# Patient Record
Sex: Female | Born: 1955 | Hispanic: No | Marital: Married | Smoking: Never smoker
Health system: Southern US, Community
[De-identification: ages and names within clinical notes are randomized; demographics above are authoritative.]

## PROBLEM LIST (undated history)

## (undated) DIAGNOSIS — F411 Generalized anxiety disorder: Secondary | ICD-10-CM

## (undated) DIAGNOSIS — M545 Low back pain: Secondary | ICD-10-CM

## (undated) DIAGNOSIS — E785 Hyperlipidemia, unspecified: Secondary | ICD-10-CM

## (undated) DIAGNOSIS — R079 Chest pain, unspecified: Secondary | ICD-10-CM

## (undated) DIAGNOSIS — IMO0002 Reserved for concepts with insufficient information to code with codable children: Secondary | ICD-10-CM

## (undated) DIAGNOSIS — R209 Unspecified disturbances of skin sensation: Secondary | ICD-10-CM

## (undated) DIAGNOSIS — I10 Essential (primary) hypertension: Secondary | ICD-10-CM

## (undated) DIAGNOSIS — K130 Diseases of lips: Secondary | ICD-10-CM

## (undated) DIAGNOSIS — Z86010 Personal history of colonic polyps: Secondary | ICD-10-CM

## (undated) DIAGNOSIS — J309 Allergic rhinitis, unspecified: Secondary | ICD-10-CM

## (undated) DIAGNOSIS — H3530 Unspecified macular degeneration: Secondary | ICD-10-CM

## (undated) HISTORY — DX: Chest pain, unspecified: R07.9

## (undated) HISTORY — PX: CHOLECYSTECTOMY: SHX55

## (undated) HISTORY — DX: Reserved for concepts with insufficient information to code with codable children: IMO0002

## (undated) HISTORY — DX: Unspecified disturbances of skin sensation: R20.9

## (undated) HISTORY — DX: Low back pain: M54.5

## (undated) HISTORY — DX: Generalized anxiety disorder: F41.1

## (undated) HISTORY — DX: Diseases of lips: K13.0

## (undated) HISTORY — DX: Allergic rhinitis, unspecified: J30.9

## (undated) HISTORY — DX: Essential (primary) hypertension: I10

## (undated) HISTORY — DX: Unspecified macular degeneration: H35.30

## (undated) HISTORY — DX: Personal history of colonic polyps: Z86.010

## (undated) HISTORY — DX: Hyperlipidemia, unspecified: E78.5

---

## 2002-08-02 ENCOUNTER — Other Ambulatory Visit: Admission: RE | Admit: 2002-08-02 | Discharge: 2002-08-02 | Payer: Self-pay | Admitting: Obstetrics & Gynecology

## 2002-08-18 ENCOUNTER — Encounter: Payer: Self-pay | Admitting: Obstetrics & Gynecology

## 2002-08-18 ENCOUNTER — Encounter: Admission: RE | Admit: 2002-08-18 | Discharge: 2002-08-18 | Payer: Self-pay | Admitting: Obstetrics & Gynecology

## 2003-02-27 ENCOUNTER — Ambulatory Visit (HOSPITAL_COMMUNITY): Admission: RE | Admit: 2003-02-27 | Discharge: 2003-02-27 | Payer: Self-pay | Admitting: Gastroenterology

## 2003-10-04 ENCOUNTER — Encounter: Payer: Self-pay | Admitting: Family Medicine

## 2003-10-04 ENCOUNTER — Encounter: Admission: RE | Admit: 2003-10-04 | Discharge: 2003-10-04 | Payer: Self-pay | Admitting: Family Medicine

## 2004-06-24 ENCOUNTER — Ambulatory Visit (HOSPITAL_COMMUNITY): Admission: RE | Admit: 2004-06-24 | Discharge: 2004-06-24 | Payer: Self-pay | Admitting: Internal Medicine

## 2005-01-01 ENCOUNTER — Ambulatory Visit (HOSPITAL_COMMUNITY): Admission: RE | Admit: 2005-01-01 | Discharge: 2005-01-01 | Payer: Self-pay | Admitting: Family Medicine

## 2005-07-02 IMAGING — CR DG CERVICAL SPINE COMPLETE 4+V
5 series · 5 of 5 positions shown · non-contrast
Comparison: none

CLINICAL DATA: Posterior neck pain.  
 CERVICAL SPINE FIVE VIEWS, 06/24/04
 There is no evidence of fracture or prevertebral soft tissue swelling. Alignment is normal. The intervertebral disk spaces are within normal limits and no other significant bone abnormalities are identified.
 IMPRESSION
 Negative cervical spine radiographs.

[view not recorded (1 of 5)]
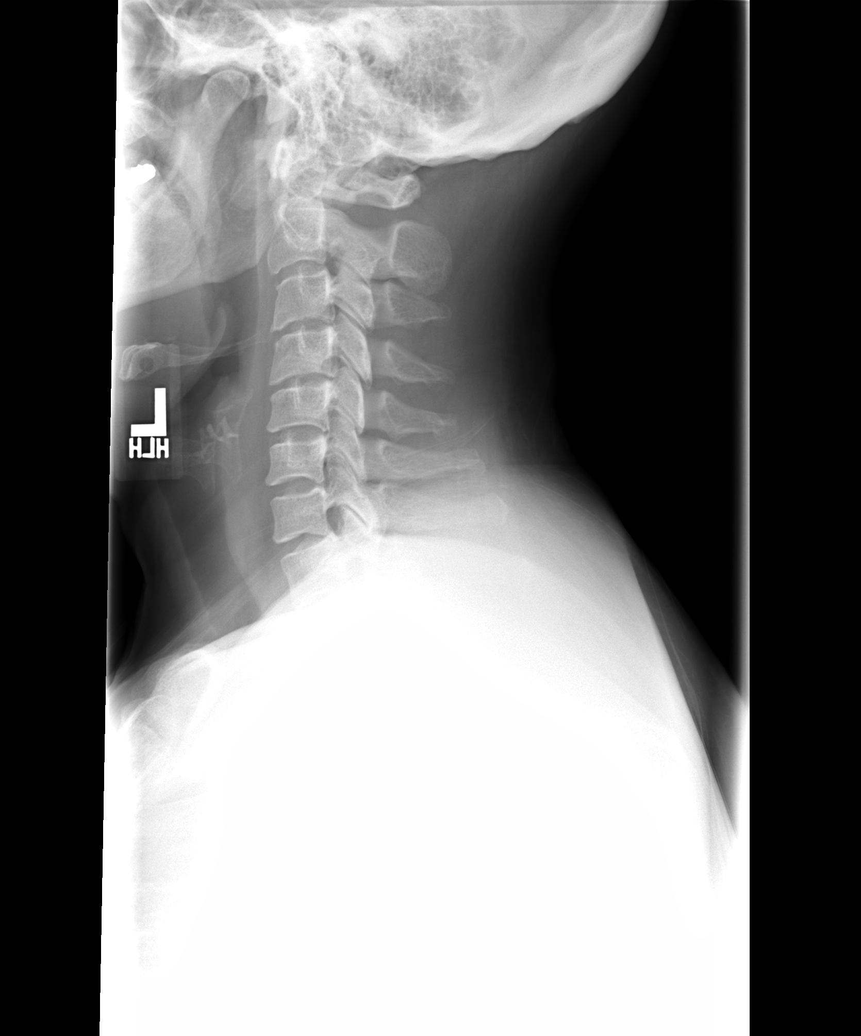

[view not recorded (2 of 5)]
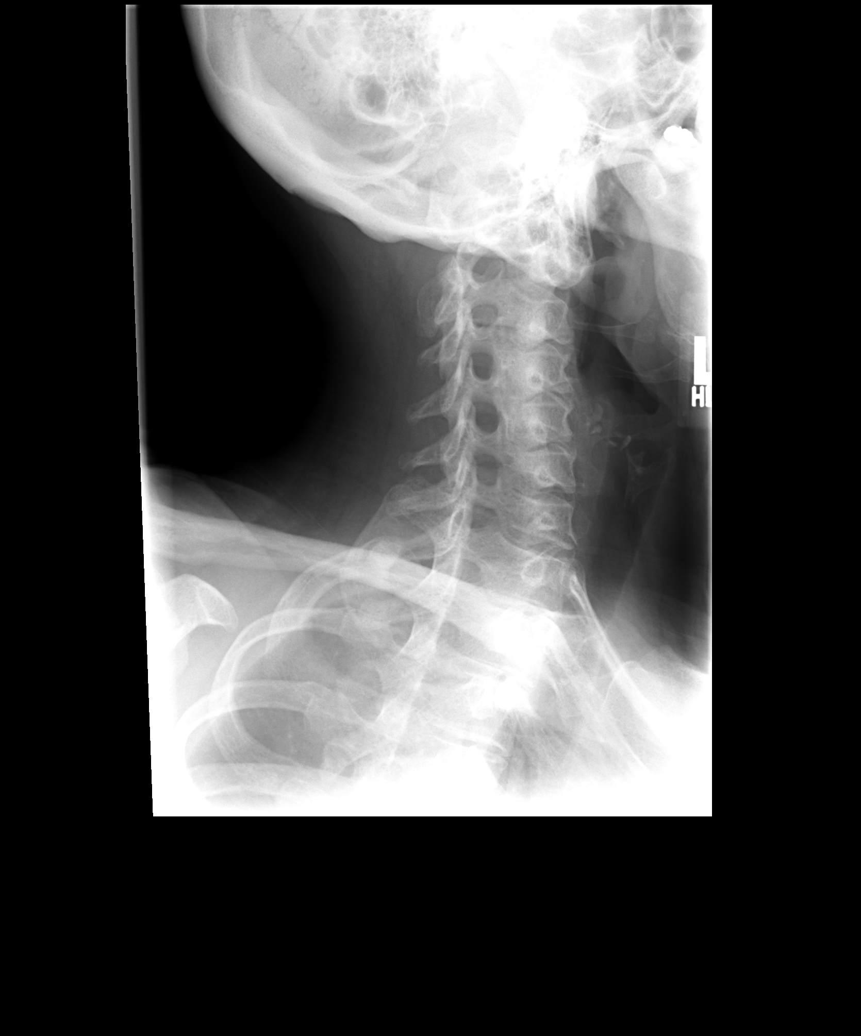

[view not recorded (3 of 5)]
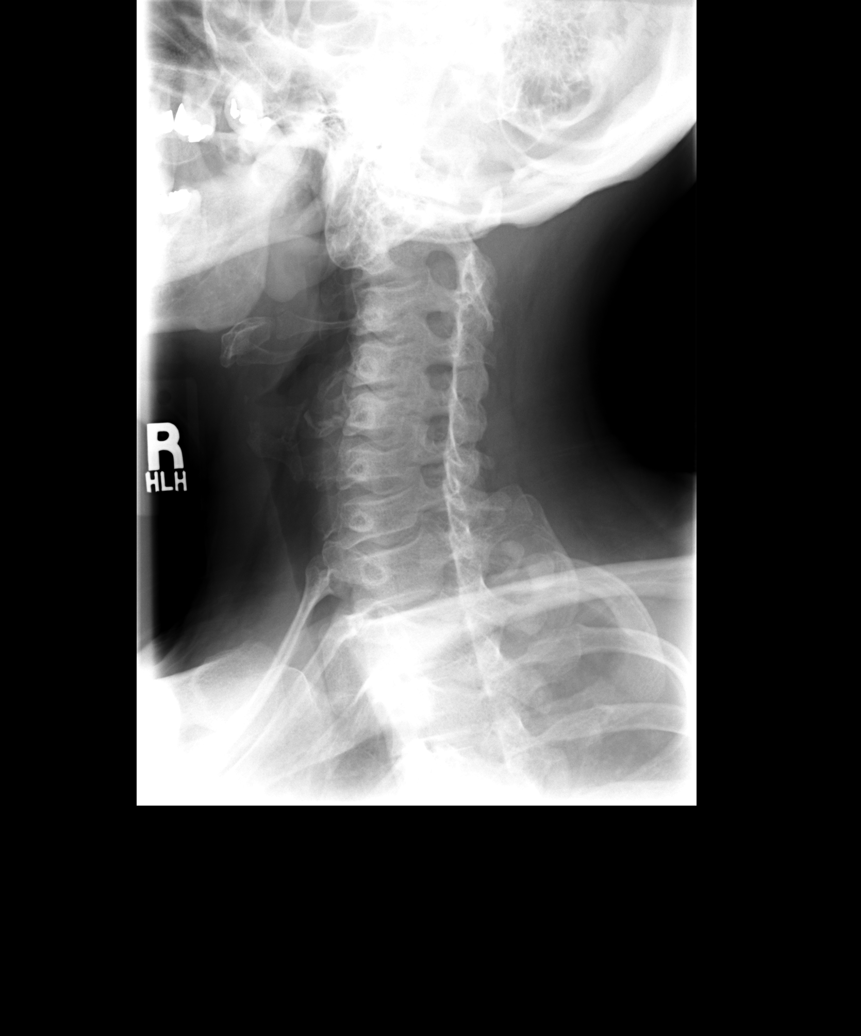

[view not recorded (4 of 5)]
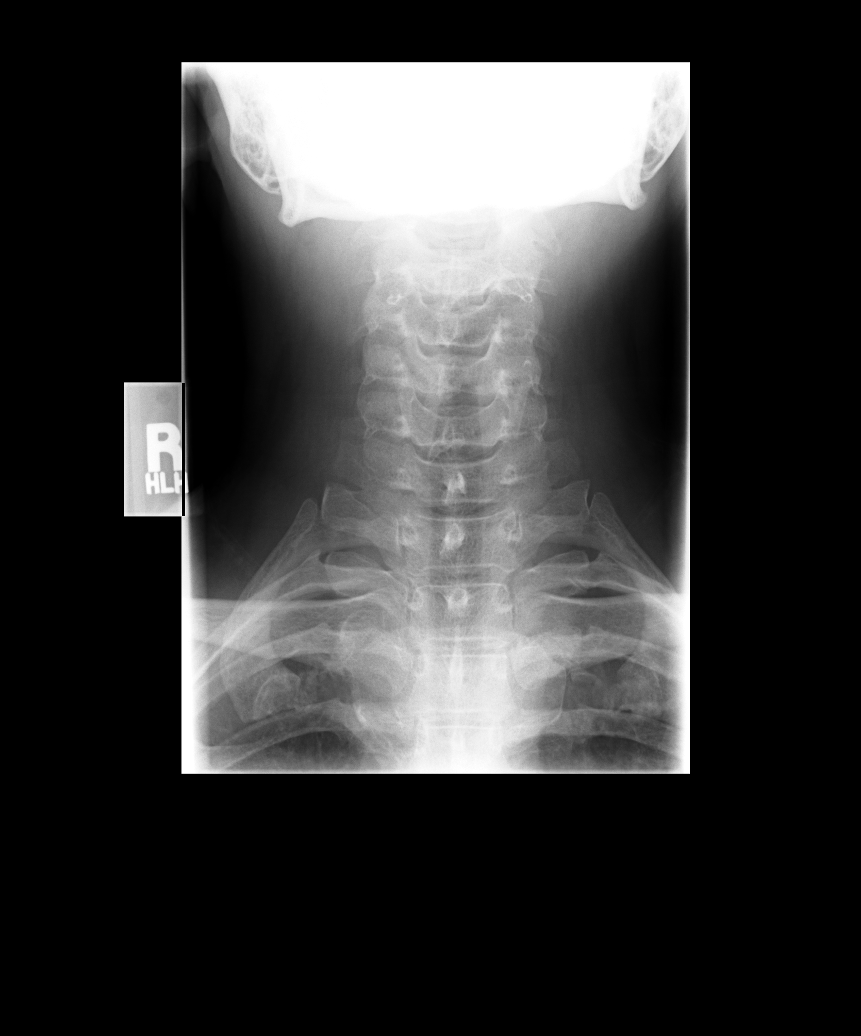

[view not recorded (5 of 5)]
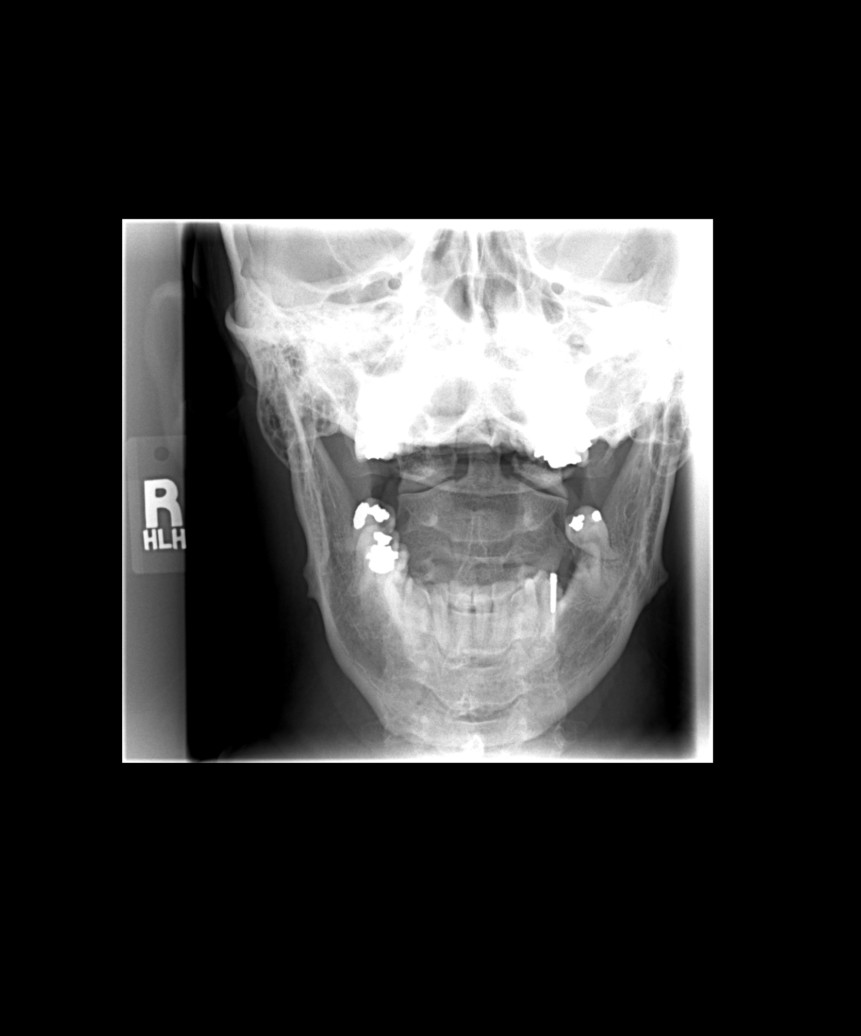

[5 of 5 positions shown; findings below may reference images not displayed]

## 2005-11-19 ENCOUNTER — Ambulatory Visit: Payer: Self-pay | Admitting: Family Medicine

## 2006-01-14 ENCOUNTER — Ambulatory Visit (HOSPITAL_COMMUNITY): Admission: RE | Admit: 2006-01-14 | Discharge: 2006-01-14 | Payer: Self-pay | Admitting: Family Medicine

## 2006-08-12 ENCOUNTER — Ambulatory Visit: Payer: Self-pay | Admitting: Family Medicine

## 2006-12-29 LAB — HM COLONOSCOPY

## 2007-09-01 ENCOUNTER — Ambulatory Visit (HOSPITAL_COMMUNITY): Admission: RE | Admit: 2007-09-01 | Discharge: 2007-09-01 | Payer: Self-pay | Admitting: Obstetrics & Gynecology

## 2008-10-09 ENCOUNTER — Ambulatory Visit (HOSPITAL_COMMUNITY): Admission: RE | Admit: 2008-10-09 | Discharge: 2008-10-09 | Payer: Self-pay | Admitting: Obstetrics & Gynecology

## 2009-11-09 LAB — HM MAMMOGRAPHY: HM Mammogram: NORMAL

## 2010-08-29 ENCOUNTER — Encounter: Payer: Self-pay | Admitting: Internal Medicine

## 2010-08-29 ENCOUNTER — Ambulatory Visit: Payer: Self-pay | Admitting: Internal Medicine

## 2010-08-29 DIAGNOSIS — I10 Essential (primary) hypertension: Secondary | ICD-10-CM | POA: Insufficient documentation

## 2010-08-29 DIAGNOSIS — Z860100 Personal history of colon polyps, unspecified: Secondary | ICD-10-CM

## 2010-08-29 DIAGNOSIS — K130 Diseases of lips: Secondary | ICD-10-CM

## 2010-08-29 DIAGNOSIS — J309 Allergic rhinitis, unspecified: Secondary | ICD-10-CM

## 2010-08-29 DIAGNOSIS — H3530 Unspecified macular degeneration: Secondary | ICD-10-CM | POA: Insufficient documentation

## 2010-08-29 DIAGNOSIS — F411 Generalized anxiety disorder: Secondary | ICD-10-CM | POA: Insufficient documentation

## 2010-08-29 DIAGNOSIS — J302 Other seasonal allergic rhinitis: Secondary | ICD-10-CM

## 2010-08-29 DIAGNOSIS — J3089 Other allergic rhinitis: Secondary | ICD-10-CM

## 2010-08-29 HISTORY — DX: Generalized anxiety disorder: F41.1

## 2010-08-29 HISTORY — DX: Essential (primary) hypertension: I10

## 2010-08-29 HISTORY — DX: Diseases of lips: K13.0

## 2010-08-29 HISTORY — DX: Unspecified macular degeneration: H35.30

## 2010-08-29 HISTORY — DX: Personal history of colon polyps, unspecified: Z86.0100

## 2010-08-29 HISTORY — DX: Allergic rhinitis, unspecified: J30.9

## 2010-08-29 HISTORY — DX: Personal history of colonic polyps: Z86.010

## 2010-08-29 LAB — CONVERTED CEMR LAB
ALT: 46 units/L — ABNORMAL HIGH (ref 0–35)
Albumin: 4 g/dL (ref 3.5–5.2)
Alkaline Phosphatase: 62 units/L (ref 39–117)
BUN: 15 mg/dL (ref 6–23)
Basophils Absolute: 0.1 10*3/uL (ref 0.0–0.1)
Bilirubin Urine: NEGATIVE
Bilirubin, Direct: 0.2 mg/dL (ref 0.0–0.3)
Chloride: 101 meq/L (ref 96–112)
Cholesterol: 226 mg/dL — ABNORMAL HIGH (ref 0–200)
Eosinophils Absolute: 0.3 10*3/uL (ref 0.0–0.7)
Eosinophils Relative: 4.9 % (ref 0.0–5.0)
GFR calc non Af Amer: 92.38 mL/min (ref >60)
Glucose, Bld: 104 mg/dL — ABNORMAL HIGH (ref 70–99)
Hemoglobin: 13.9 g/dL (ref 12.0–15.0)
Monocytes Relative: 8.5 % (ref 3.0–12.0)
Potassium: 4.2 meq/L (ref 3.5–5.1)
RBC: 4.61 M/uL (ref 3.87–5.11)
Sodium: 140 meq/L (ref 135–145)
TSH: 1.49 microintl units/mL (ref 0.35–5.50)
Total Protein, Urine: NEGATIVE mg/dL
Total Protein: 6.6 g/dL (ref 6.0–8.3)
Triglycerides: 113 mg/dL (ref 0.0–149.0)
VLDL: 22.6 mg/dL (ref 0.0–40.0)
Vitamin B-12: 480 pg/mL (ref 211–911)
WBC: 6.4 10*3/uL (ref 4.5–10.5)
pH: 6 (ref 5.0–8.0)

## 2011-01-28 NOTE — Assessment & Plan Note (Signed)
Summary: NEW PT FOR CPX/BCBS/#/CD   Vital Signs:  Patient profile:   55 year old female Height:      62 inches Weight:      130.38 pounds BMI:     23.93 O2 Sat:      95 % Temp:     98.2 degrees F oral Pulse rate:   72 / minute BP sitting:   116 / 74  (left arm) Cuff size:   regular  Vitals Entered By: Margaret Pyle, CMA (August 29, 2010 8:41 AM)  Preventive Care Screening  Colonoscopy:    Date:  12/29/2006    Next Due:  12/2016    Results:  Hyperplastic Polyp   Last Tetanus Booster:    Date:  08/29/2010    Results:  Tdap  Mammogram:    Date:  11/09/2009    Results:  normal   Pap Smear:    Date:  11/07/2009    Results:  normal   CC: New Pt- Establish Care Is Patient Diabetic? No   CC:  New Pt- Establish Care.  History of Present Illness: here to establish , coming here as husband also establshing as well;  started menopause renct;  LMP march 2011;  c/o recurrent insomnia, and eye and nasal allergy symptoms with congetsion for several months with pain and pressure but no fever;  has rare nosebleed as well.  Also with ongoing anxiety without depressive symtpoms, but has near panic on occasion.  Also wtih signficiant trobule getting to sleep..  No suicidal ideation.   Preventive Screening-Counseling & Management  Alcohol-Tobacco     Smoking Status: never      Drug Use:  no.    Problems Prior to Update: None  Medications Prior to Update: 1)  None  Current Medications (verified): 1)  Singulair 10 Mg Tabs (Montelukast Sodium) .Marland Kitchen.. 1 Tab By Mouth Once Daily 2)  Melatonin 2.5 Mg Tabs (Melatonin) .Marland Kitchen.. 1 Tab By Mouth At Bedtime As Needed For Sleep 3)  Hydrochlorothiazide 25 Mg Tabs (Hydrochlorothiazide) .Marland Kitchen.. 1 Tab By Mouth Once Daily 4)  Fluoxetine Hcl 20 Mg Caps (Fluoxetine Hcl) .Marland Kitchen.. 1 By Mouth Once Daily 5)  Alprazolam 0.5 Mg Tabs (Alprazolam) .... 1/2 - 1 By Mouth Once Daily As Needed 6)  Zolpidem Tartrate 10 Mg Tabs (Zolpidem Tartrate) .Marland Kitchen.. 1po At  Bedtime As Needed Sleep 7)  Fluticasone Propionate 50 Mcg/act Susp (Fluticasone Propionate) .... 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 Family History of Arthritis - mother Family History Breast cancer 1st degree relative - sister at 83yo  Family History Hypertension - father Family History Lung cancer - father   Social History: Reviewed history and no changes required. 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 Never Smoked Alcohol use-no Drug use-no came to [place] from [place] 1995 due to religious intolerance thereSmoking Status:  never Drug Use:  no  Review of Systems  The patient denies anorexia, fever, weight loss, weight gain, vision loss, decreased hearing, hoarseness, chest pain, syncope, dyspnea on exertion, peripheral edema, prolonged cough, headaches, hemoptysis, abdominal pain, melena, hematochezia, severe indigestion/heartburn, hematuria, muscle weakness, suspicious skin lesions, transient blindness, difficulty walking, depression, unusual weight change, abnormal bleeding, enlarged lymph nodes, and angioedema.         all otherwise negative per pt -  does have right side angular chelitis as well, worse after being at the beach  Physical Exam  General:   alert and well-developed.   Head:  normocephalic and atraumatic.   Eyes:  vision grossly intact, pupils equal, and pupils round.   Ears:  R ear normal and L ear normal.   Nose:  no external deformity and no nasal discharge.   Mouth:  no gingival abnormalities and pharynx pink and moist.   Neck:  supple and no masses.   Lungs:  normal respiratory effort and normal breath sounds.   Heart:  normal rate and regular rhythm.   Abdomen:  soft, non-tender, and normal bowel sounds.   Msk:  no joint tenderness and no joint swelling.   Extremities:  no edema, no erythema  Neurologic:  cranial nerves II-XII intact and strength normal in all extremities.   Skin:  color normal and no rashes.   Psych:  memory intact for recent and remote and normally interactive.     Impression & Recommendations:  Problem # 1:  Preventive Health Care (ICD-V70.0)  Overall doing well, age appropriate education and counseling updated and referral for appropriate preventive services done unless declined, immunizations up to date or declined, diet counseling done if overweight, urged to quit smoking if smokes , most recent labs reviewed and current ordered if appropriate, ecg reviewed or declined (interpretation per ECG scanned in the EMR if done); information regarding Medicare Prevention requirements given if appropriate; speciality referrals updated as appropriate   Orders: TLB-BMP (Basic Metabolic Panel-BMET) (80048-METABOL) TLB-CBC Platelet - w/Differential (85025-CBCD) TLB-Hepatic/Liver Function Pnl (80076-HEPATIC) TLB-TSH (Thyroid Stimulating Hormone) (84443-TSH) TLB-Lipid Panel (80061-LIPID) TLB-Udip ONLY (81003-UDIP)  Problem # 2:  ALLERGIC RHINITIS (ICD-477.9)  Her updated medication list for this problem includes:    Fluticasone Propionate 50 Mcg/act Susp (Fluticasone propionate) .Marland Kitchen... 2 spray/side once daily treat as above, f/u any worsening signs or symptoms , You can also use Mucinex OTC or it's generic  for congestion   Problem # 3:  ANXIETY (ICD-300.00)  Her updated medication list for this problem includes:    Fluoxetine Hcl 20 Mg Caps (Fluoxetine hcl) .Marland Kitchen... 1 by mouth once daily    Alprazolam 0.5 Mg Tabs (Alprazolam) .Marland Kitchen... 1/2 - 1 by mouth once daily as needed treat as above, f/u any worsening signs or symptoms   Problem # 4:  CHEILITIS, ANGULAR (ICD-528.5)  for lotrisone as needed, as well as B12 check  Orders: TLB-B12 + Folate Pnl ([id number])  Complete Medication List: 1)  Singulair 10 Mg Tabs (Montelukast sodium) .Marland Kitchen.. 1 tab by mouth once daily 2)  Melatonin 2.5 Mg Tabs (Melatonin) .Marland Kitchen.. 1 tab by mouth at bedtime as needed for sleep 3)  Hydrochlorothiazide 25 Mg Tabs (Hydrochlorothiazide) .Marland Kitchen.. 1 tab by mouth once daily 4)  Fluoxetine Hcl 20 Mg Caps (Fluoxetine hcl) .Marland Kitchen.. 1 by mouth once daily 5)  Alprazolam 0.5 Mg Tabs (Alprazolam) .... 1/2 - 1 by mouth once daily as needed 6)  Zolpidem Tartrate 10 Mg Tabs (Zolpidem tartrate) .Marland Kitchen.. 1po at bedtime as needed sleep 7)  Fluticasone Propionate  50 Mcg/act Susp (Fluticasone propionate) .... 2 spray/side once daily  Other Orders: Tdap => 81yrs IM (62130) Admin 1st Vaccine (86578) Flu Vaccine 57yrs + [id number])  Patient Instructions: 1)  you had flu shot and tetanus shots 2)  Please take all new medications as prescribed - the generic nasal spray = flonase, the generic prozac daily for nerves, the alprazolam for only as needed for nerves, and the generic ambien (zolpidem) to use only as needed for sleep 3)  You can also use Mucinex OTC or it's generic for congestion  4)  Please go to the Lab in the basement for your blood and/or urine tests today 5)  Please call the number on the [facility] Card for results of your testing 6)  OK to stop the melatonin 7)  Please schedule a follow-up appointment in 1 year, or sooner if needed Prescriptions: SINGULAIR 10 MG TABS (MONTELUKAST SODIUM) 1 tab by mouth once daily  #90 x 3   Entered and  Authorized by:   Corwin Levins MD   Signed by:   Corwin Levins MD on 08/29/2010   Method used:   Print then Give to Patient   RxID:   [id number] HYDROCHLOROTHIAZIDE 25 MG TABS (HYDROCHLOROTHIAZIDE) 1 tab by mouth once daily  #90 x 3   Entered and Authorized by:   Corwin Levins MD   Signed by:   Corwin Levins MD on 08/29/2010   Method used:   Print then Give to Patient   RxID:   [id number] FLUTICASONE PROPIONATE 50 MCG/ACT SUSP (FLUTICASONE PROPIONATE) 2 spray/side once daily  #1 x 11   Entered and Authorized by:   Corwin Levins MD   Signed by:   Corwin Levins MD on 08/29/2010   Method used:   Print then Give to Patient   RxID:   [id number] ZOLPIDEM TARTRATE 10 MG TABS (ZOLPIDEM TARTRATE) 1po at bedtime as needed sleep  #30 x 5   Entered and Authorized by:   Corwin Levins MD   Signed by:   Corwin Levins MD on 08/29/2010   Method used:   Print then Give to Patient   RxID:   [id number] ALPRAZOLAM 0.5 MG TABS (ALPRAZOLAM) 1/2 - 1 by mouth once daily as needed  #30 x 5   Entered and Authorized by:   Corwin Levins MD   Signed by:   Corwin Levins MD on 08/29/2010   Method used:   Print then Give to Patient   RxID:   [id number] FLUOXETINE HCL 20 MG CAPS (FLUOXETINE HCL) 1 by mouth once daily  #90 x 3   Entered and Authorized by:   Corwin Levins MD   Signed by:   Corwin Levins MD on 08/29/2010   Method used:   Print then Give to Patient   RxID:   [id number]   Prevention & Chronic Care Immunizations   Influenza vaccine: Fluvax 3+  (08/29/2010)    Tetanus booster: 08/29/2010: Tdap    Pneumococcal vaccine: Not documented  Colorectal Screening   Hemoccult: Not documented    Colonoscopy: Hyperplastic Polyp  (12/29/2006)   Colonoscopy due: 12/2016  Other Screening   Pap smear: normal  (11/07/2009)    Mammogram: normal  (11/09/2009)   Smoking status: never  (08/29/2010)  Lipids   Total Cholesterol: Not documented   LDL: Not documented   LDL Direct: Not  documented   HDL: Not documented   Triglycerides: Not documented  Hypertension  Last Blood Pressure: 116 / 74  (08/29/2010)   Serum creatinine: Not documented   Serum potassium Not documented  Self-Management Support :    Hypertension self-management support: Not documented   Preventive Care Screening  Colonoscopy:    Date:  12/29/2006    Next Due:  12/2016    Results:  Hyperplastic Polyp   Last Tetanus Booster:    Date:  08/29/2010    Results:  Tdap  Mammogram:    Date:  11/09/2009    Results:  normal   Pap Smear:    Date:  11/07/2009    Results:  normal     Immunizations Administered:  Tetanus Vaccine:    Vaccine Type: Tdap    Site: left deltoid    Mfr: GlaxoSmithKline    Dose: 0.5 ml    Route: IM    Given by: Margaret Pyle, CMA    Exp. Date: 10/17/2012    Lot #: [id number]    VIS given: 11/15/08 version given August 29, 2010. Flu Vaccine Consent Questions     Do you have a history of severe allergic reactions to this vaccine? no    Any prior history of allergic reactions to egg and/or gelatin? no    Do you have a sensitivity to the preservative Thimersol? no    Do you have a past history of Guillan-Barre Syndrome? no    Do you currently have an acute febrile illness? no    Have you ever had a severe reaction to latex? no    Vaccine information given and explained to patient? yes    Are you currently pregnant? no    Lot Number:[id number]   Exp Date:06/28/2011   Site Given  Left Deltoid IM #: [id number]    VIS given: 11/15/08 version given August 29, 2010.  Marland Kitchenlbflu

## 2011-01-29 ENCOUNTER — Telehealth: Payer: Self-pay | Admitting: Internal Medicine

## 2011-01-29 ENCOUNTER — Ambulatory Visit (INDEPENDENT_AMBULATORY_CARE_PROVIDER_SITE_OTHER): Payer: BC Managed Care – PPO | Admitting: Internal Medicine

## 2011-01-29 ENCOUNTER — Encounter: Payer: Self-pay | Admitting: Internal Medicine

## 2011-01-29 DIAGNOSIS — R079 Chest pain, unspecified: Secondary | ICD-10-CM

## 2011-01-29 DIAGNOSIS — I10 Essential (primary) hypertension: Secondary | ICD-10-CM

## 2011-01-29 DIAGNOSIS — R209 Unspecified disturbances of skin sensation: Secondary | ICD-10-CM

## 2011-01-29 DIAGNOSIS — F411 Generalized anxiety disorder: Secondary | ICD-10-CM

## 2011-01-29 HISTORY — DX: Chest pain, unspecified: R07.9

## 2011-01-29 HISTORY — DX: Unspecified disturbances of skin sensation: R20.9

## 2011-01-30 ENCOUNTER — Ambulatory Visit: Payer: Self-pay | Admitting: Internal Medicine

## 2011-02-05 NOTE — Progress Notes (Signed)
----   Converted from flag ---- ---- 01/29/2011 2:21 PM, Corwin Levins MD wrote: pleaase call pt - to recommend taking ASA 81 mg - COATED only in light of her age and hx of HTN (I did not get a chance to mention this when she was here) ------------------------------  called pt. left msg to call back  patient called back informed of above information.

## 2011-02-05 NOTE — Assessment & Plan Note (Signed)
Summary: LEG GETS NUMB/ NWS  #   Vital Signs:  Patient profile:   55 year old female Height:      61 inches Weight:      135.13 pounds BMI:     25.62 O2 Sat:      94 % on Room air Temp:     98.7 degrees F oral Pulse rate:   71 / minute BP sitting:   122 / 80  (left arm) Cuff size:   regular  Vitals Entered By: Zella Ball Ewing CMA (AAMA) (January 29, 2011 10:50 AM)  O2 Flow:  Room air CC: Right leg numbness and pain/RE   CC:  Right leg numbness and pain/RE.  History of Present Illness: here with subacute onset 2-3 wks mild to mod numbness/pins and needles to right anteror thigh only, intermittent to start ,  seemed to occur over this time more with sitting more than a few minutes but not clearly better with standing again.  Here today after she was discussing with her physician son (who lives in another state) and suggested she get checked to r/o DVT or stroke vs other.  Today, pt states over the past 3 days in fact the numbness became more persistent and constant (only intemittent before) , still mild to mod to the right ant thigh only but for some reason on getting up this AM the numbness is less again in severity , but still constant, but fortunately not assoc with pain or LE weakness;  /// Pt states she has  had recurring pain to the right lower back intemittent for years depending on position but minor in severity to her, lasted minutes to hours only, not usually requiring any tx such as tylenol or other, and not assoc then with  bowel or bladder changes, gait change or falls (and none in the past 3 wks as well). ///Also mentions acute incrase in stress over the past 3-4 days as well.   Has been more stressed in the past few days   - brother inlaw with MI in [place] and husband just left to see him so she is alone, with only her son on the phone for support. Denies worsening depressive symptoms, suicidal ideation, or panic., but does have signficiant anxiety, usually better  controlled than last few days///Pt also with other c/o  myalgias in the past few months to the back , arms, left upper chest that she does not think is related to the leg. Pt with  no spine or LE MRI, EMG/NCS, or dopplers in the past.  Gained 5 lbs since last seem - she thinks due to menoipause and diet.  Pants are tighter but not painful.  Tends to sleep in a "bent position" at the waist No headache, blurred vision and Pt denies other new neuro symptoms such as headache, facial or extremity weakness   Problems Prior to Update: 1)  Chest Pain  (ICD-786.50) 2)  Paresthesia  (ICD-782.0) 3)  Cheilitis, Angular  (ICD-528.5) 4)  Preventive Health Care  (ICD-V70.0) 5)  Anxiety  (ICD-300.00) 6)  Macular Degeneration  (ICD-362.50) 7)  Allergic Rhinitis  (ICD-477.9) 8)  Colonic Polyps, Hx of  (ICD-V12.72) 9)  Hypertension  (ICD-401.9) 10)  Family History Breast Cancer 1st Degree Relative <50  (ICD-V16.3)  Medications Prior to Update: 1)  Singulair 10 Mg Tabs (Montelukast Sodium) .Marland Kitchen.. 1 Tab By Mouth Once Daily 2)  Melatonin 2.5 Mg Tabs (Melatonin) .Marland Kitchen.. 1 Tab By Mouth At Bedtime As Needed For  Sleep 3)  Hydrochlorothiazide 25 Mg Tabs (Hydrochlorothiazide) .Marland Kitchen.. 1 Tab By Mouth Once Daily 4)  Fluoxetine Hcl 20 Mg Caps (Fluoxetine Hcl) .Marland Kitchen.. 1 By Mouth Once Daily 5)  Alprazolam 0.5 Mg Tabs (Alprazolam) .... 1/2 - 1 By Mouth Once Daily As Needed 6)  Zolpidem Tartrate 10 Mg Tabs (Zolpidem Tartrate) .Marland Kitchen.. 1po At Bedtime As Needed Sleep 7)  Fluticasone Propionate 50 Mcg/act Susp (Fluticasone Propionate) .... 2 Spray/side Once Daily  Current Medications (verified): 1)  Singulair 10 Mg Tabs (Montelukast Sodium) .Marland Kitchen.. 1 Tab By Mouth Once Daily 2)  Melatonin 2.5 Mg Tabs (Melatonin) .Marland Kitchen.. 1 Tab By Mouth At Bedtime As Needed For Sleep 3)  Hydrochlorothiazide 25 Mg Tabs (Hydrochlorothiazide) .Marland Kitchen.. 1 Tab By Mouth Once Daily 4)  Fluoxetine Hcl 20 Mg Caps (Fluoxetine Hcl) .Marland Kitchen.. 1 By Mouth Once Daily 5)  Alprazolam 0.5 Mg  Tabs (Alprazolam) .... 1/2 - 1 By Mouth Once Daily As Needed 6)  Zolpidem Tartrate 10 Mg Tabs (Zolpidem Tartrate) .Marland Kitchen.. 1po At Bedtime As Needed Sleep 7)  Fluticasone Propionate 50 Mcg/act Susp (Fluticasone Propionate) .... 2 Spray/side Once Daily 8)  Prednisone 10 Mg Tabs (Prednisone) .Marland Kitchen.. 1 By Mouth Once Daily  For 7 Days 9)  Aspir-Low 81 Mg Tbec (Aspirin) .Marland Kitchen.. 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 bursitis/bunoin (but none now);  also with typical right heel tenderness and mild pain for 2 wks with first standgin in the am or getting up from sitting for > 30 min;  also with respect to the left upper chest pain - sharp. intermittent for yrs, nonexertional, not assoc with sob, n/v, diaphoresis, palp, dizziness/  Physical Exam  General:  alert and overweight-appearing - mild only Head:  normocephalic and atraumatic.   Eyes:  vision grossly intact, pupils equal, and pupils round.   Ears:  R ear normal and L ear normal.   Nose:  no external deformity and no nasal discharge.   Mouth:  no gingival abnormalities and pharynx pink and moist.   Neck:  supple and no masses.   Lungs:  normal respiratory effort and normal breath sounds.   Heart:  normal rate and regular rhythm.   Abdomen:  soft, non-tender, and normal bowel sounds.   Msk:  no joint tenderness  and no joint swelling.  , no spine or paraspinal tenderness, swelling, rash; no calf or leg swelling or tenderness as well  Extremities:  no edema, no erythema  Neurologic:  except for possible mild right ant thigh decreased sensation to LTalert & oriented X3, cranial nerves II-XII intact, strength normal in all extremities, sensation intact to light touch, gait normal, and DTRs symmetrical and normal.   Skin:  color normal and no rashes.   Psych:  not depressed appearing and moderately anxious.     Impression & Recommendations:  Problem # 1:  PARESTHESIA (ICD-782.0) does have some evidence of mild decreased  sensation to left ant thigh, exam o/w benign for right handed pt;  I suspect meralgia paresthetica vs lumbar related , doubt CVA or PAD related;  no sign or symptoms suggestive of DVT;  ok for low dose predpack for now,  reassured, consider EMG/NCS or Lumbar MRI  or check b12 but she declines at this time;  to f/u any worsening symptoms  Problem # 2:  CHEST PAIN (ICD-786.50)  c/w MSK I suspect, ecg reviewed - NSC from 2011; we'll hold on cxr at this time without other symptoms such as sob  Orders: EKG w/ Interpretation (93000)  Problem # 3:  ANXIETY (ICD-300.00)  Her updated medication list for this problem includes:    Fluoxetine Hcl 20 Mg Caps (Fluoxetine hcl) .Marland Kitchen... 1 by mouth once daily    Alprazolam 0.5 Mg Tabs (Alprazolam) .Marland Kitchen... 1/2 - 1 by mouth once daily as needed stable overall by hx and exam, ok to continue meds/tx as is -   Problem # 4:  HYPERTENSION (ICD-401.9)  Her updated medication list for this problem includes:    Hydrochlorothiazide 25 Mg Tabs (Hydrochlorothiazide) .Marland Kitchen... 1 tab by mouth once daily  BP today: 122/80 Prior BP: 116/74 (08/29/2010)  Labs Reviewed: K+: 4.2 (08/29/2010) Creat: : 0.7 (08/29/2010)   Chol: 226 (08/29/2010)   HDL: 51.50 (08/29/2010)   TG: 113.0 (08/29/2010) stable overall by hx and exam, ok to continue meds/tx as is   Her updated  medication list for this problem includes:    Hydrochlorothiazide 25 Mg Tabs (Hydrochlorothiazide) .Marland Kitchen... 1 tab by mouth once daily  Complete Medication List: 1)  Singulair 10 Mg Tabs (Montelukast sodium) .Marland Kitchen.. 1 tab by mouth once daily 2)  Melatonin 2.5 Mg Tabs (Melatonin) .Marland Kitchen.. 1 tab by mouth at bedtime as needed for sleep 3)  Hydrochlorothiazide 25 Mg Tabs (Hydrochlorothiazide) .Marland Kitchen.. 1 tab by mouth once daily 4)  Fluoxetine Hcl 20 Mg Caps (Fluoxetine hcl) .Marland Kitchen.. 1 by mouth once daily 5)  Alprazolam 0.5 Mg Tabs (Alprazolam) .... 1/2 - 1 by mouth once daily as needed 6)  Zolpidem Tartrate 10 Mg Tabs (Zolpidem tartrate) .Marland Kitchen.. 1po at bedtime as needed sleep 7)  Fluticasone Propionate 50 Mcg/act Susp (Fluticasone propionate) .... 2 spray/side once daily 8)  Prednisone 10 Mg Tabs (Prednisone) .Marland Kitchen.. 1 by mouth once daily  for 7 days 9)  Aspir-low 81 Mg Tbec (Aspirin) .Marland Kitchen.. 1po once daily  Patient Instructions: 1)  Please take all new medications as prescribed - prednisone for anti-inflammatory 2)  Continue all previous medications as before this visit 3)  Your EKG was good today 4)  Please follow lower cholesterol diet 5)  Please call or return if the numbness gets worse again, or if you develop pain or weakness , to consider nerve test for the leg, or even MRI for the lower back 6)  Please schedule a follow-up appointment in Sept 2012 for CPX with labs 7)  Take an Aspirin every day - 81 mg - 1 per day Prescriptions: PREDNISONE 10 MG TABS (PREDNISONE) 1 by mouth once daily  for 7 days  #7 x 0   Entered and Authorized by:   Corwin Levins MD   Signed by:   Corwin Levins MD on 01/29/2011   Method used:   Print then Give to Patient   RxID:   [id number]    Orders Added: 1)  EKG w/ Interpretation [93000] 2)  Est. Patient Level IV [56213]

## 2011-02-28 ENCOUNTER — Telehealth: Payer: Self-pay | Admitting: Internal Medicine

## 2011-02-28 DIAGNOSIS — IMO0002 Reserved for concepts with insufficient information to code with codable children: Secondary | ICD-10-CM

## 2011-02-28 HISTORY — DX: Reserved for concepts with insufficient information to code with codable children: IMO0002

## 2011-03-03 ENCOUNTER — Telehealth: Payer: Self-pay | Admitting: Internal Medicine

## 2011-03-06 NOTE — Progress Notes (Addendum)
Summary: referral   Phone Note Call from Patient Call back at Home Phone [phone]   Caller: Patient Summary of Call: Pt called requesting referral for MRI to evalute continued leg pain, numbness and tingling. Pt is requesting [facility] (am appt). Initial call taken by: Margaret Pyle, CMA,  February 28, 2011 4:38 PM  New Problems: THORACIC/LUMBOSACRAL NEURITIS/RADICULITIS UNSPEC (ICD-724.4)   New Problems: THORACIC/LUMBOSACRAL NEURITIS/RADICULITIS UNSPEC (ICD-724.4)  Appended Document: referral  order done

## 2011-03-11 NOTE — Progress Notes (Signed)
Summary: MRI Referral  Phone Note Call from Patient   Summary of Call: Dr Jonny Ruiz, pt called and said that her chirpractor has scheduled her for an MRI so referral is not need. Initial call taken by: Dagoberto Reef,  March 03, 2011 2:00 PM  Follow-up for Phone Call        ok  - will cancel Follow-up by: Corwin Levins MD,  March 03, 2011 5:02 PM

## 2011-03-18 ENCOUNTER — Telehealth: Payer: Self-pay

## 2011-03-18 NOTE — Telephone Encounter (Signed)
Need reason for referral ,  And copy of the MRI results

## 2011-03-18 NOTE — Telephone Encounter (Signed)
Pt called stating that she had MRI done at Chiropractor's office and was advised to see a Neurologist. Pt needed referral from PCP to [facility] Neurology Dr Porfirio Mylar Dohmeier.

## 2011-03-18 NOTE — Telephone Encounter (Signed)
Pt advised to have results of MRI sent to Dr Melvyn Novas attention for review prior to referral. Pt agreed and will have notes sent.

## 2011-03-27 ENCOUNTER — Encounter: Payer: Self-pay | Admitting: Internal Medicine

## 2011-05-16 NOTE — Op Note (Signed)
Sierra English, Sierra English                      ACCOUNT NO.:  [id number]   MEDICAL RECORD NO.:  [id number]                   PATIENT TYPE:  AMB   LOCATION:  ENDO                                 FACILITY:  [facility]   PHYSICIAN:  Anselmo Rod, M.D.               DATE OF BIRTH:  Jan 17, 1956   DATE OF PROCEDURE:  02/27/2003  DATE OF DISCHARGE:                                 OPERATIVE REPORT   PROCEDURE PERFORMED:  Esophagogastroduodenoscopy with biopsies.   ENDOSCOPIST:  Anselmo Rod, M.D.   INSTRUMENT USED:  Olympus video panendoscope.   INDICATIONS FOR PROCEDURE:  A 55 year old [place] female with blood in stool  and an essentially unrevealing colonoscopy.  EGD is being done to rule out  peptic ulcer disease, esophagitis, gastritis, etc.   PREPROCEDURE PREPARATION:  Informed consent was procured from the patient.  The patient had fasted for eight hours prior to the procedure.   PREPROCEDURE PHYSICAL:  VITAL SIGNS:  The patient has stable vital signs.  NECK:  Supple.  CHEST:  Clear to auscultation, S1, S2.  ABDOMEN:  Soft with normal bowel sounds.   DESCRIPTION OF PROCEDURE:  The patient was placed in the left lateral  decubitus position and sedated with 30 mg of Demerol and 3 mg of Versed  intravenously.  Once the patient was adequately sedated and maintained on  low-flow oxygen, the Olympus video panendoscope was advanced through the  mouthpiece, over the tongue, into the esophagus under direct vision.  The  entire esophagus appeared normal with no evidence of ring, stricture,  masses, esophagitis, or Barrett's mucosa.  The scope was then advanced into  the stomach.  Retroflexion in the high cardia revealed no abnormalities.  Below, a few antral erosions were noted.  Biopsies were done from the antrum  to rule out presence of H. pylori by CLOtest.  No frank ulcers, masses or  polyps were seen.  There was no evidence of AVM.  The duodenal bulb and the  proximal small bowel  distal to the bulb appeared normal.  There was no  outlet obstruction.  The patient tolerated the procedure well without  complications.   IMPRESSION:  1. Normal-appearing esophagus and proximal small bowel.  2. Few antral erosions, biopsy done for CLOtest.  3. No large ulcers, masses, or polyps seen.   RECOMMENDATIONS:  1. Treat with antibiotics if CLOtest is positive.  2.     Avoid nonsteroidals including aspirin.  3. Repeat guaiacs on an outpatient basis.  4. Outpatient follow-up in the next two weeks or earlier if need be.                                               Anselmo Rod, M.D.    JNM/MEDQ  D:  02/27/2003  T:  02/27/2003  Job:  [id number]   cc:   Dr. Harmon Pier

## 2011-05-16 NOTE — Op Note (Signed)
   Sierra English, Sierra English                      ACCOUNT NO.:  [id number]   MEDICAL RECORD NO.:  [id number]                   PATIENT TYPE:  AMB   LOCATION:  ENDO                                 FACILITY:  [facility]   PHYSICIAN:  Anselmo Rod, M.D.               DATE OF BIRTH:  October 01, 1956   DATE OF PROCEDURE:  02/27/2003  DATE OF DISCHARGE:                                 OPERATIVE REPORT   PROCEDURE PERFORMED:  Colonoscopy.   ENDOSCOPIST:  Charna Elizabeth, M.D.   INSTRUMENT USED:  Olympus video colonoscope.   INDICATIONS FOR PROCEDURE:  The patient is a 55 year old [place] female with  blood in stool.  Rule out colonic polyps, masses, etc.   PREPROCEDURE PREPARATION:  Informed consent was procured from the patient.  The patient was fasted for eight hours prior to the procedure and prepped  with a bottle of  MiraLax the night prior to the procedure.  She also  maintained herself on a liquid diet for three to four days prior to the  procedure.   PREPROCEDURE PHYSICAL:  The patient had stable vital signs.  Neck supple.  Chest clear to auscultation.  S1 and S2 regular.  Abdomen soft with normal  bowel sounds.   DESCRIPTION OF PROCEDURE:  The patient was placed in left lateral decubitus  position and sedated with 70 mg of Demerol and 7 mg of Versed intravenously.  Once the patient was adequately sedated and maintained on low flow oxygen  and continuous cardiac monitoring, the Olympus video colonoscope was  advanced from the rectum to the cecum and terminal ileum without difficulty.  The patient had small internal hemorrhoids seen on retroflexion.  There was  isolated diverticulum seen in the right colon.  No masses, polyps or  erosions were seen.  The patient tolerated the procedure well without  complications.   IMPRESSION:  Healthy-appearing colon up to terminal ileum except for an  isolated diverticulum in the right colon (small) and a small internal  hemorrhoid (see on  retroflexion).   RECOMMENDATIONS:  1. High fiber diet has been discussed with the patient in great detail and     brochures have been given to her for her     education.  2. Proceed with esophagogastroduodenoscopy at this time for further     evaluation of blood in stool.  Further recommendations to be made     thereafter.                                                    Anselmo Rod, M.D.    JNM/MEDQ  D:  02/27/2003  T:  02/27/2003  Job:  [id number]   cc:   Dr.  Sherrie Mustache

## 2011-05-27 ENCOUNTER — Telehealth: Payer: Self-pay

## 2011-05-27 DIAGNOSIS — M545 Low back pain, unspecified: Secondary | ICD-10-CM

## 2011-05-27 HISTORY — DX: Low back pain, unspecified: M54.50

## 2011-05-27 NOTE — Telephone Encounter (Signed)
Pt called requesting results of MRI done 02/2011. Pt is also requesting referral to appropriate specialist. Pt states that leg pain is worse with numbness and weakness, please advise.  MRI printed and placed on MD's desk for review

## 2011-05-27 NOTE — Telephone Encounter (Signed)
Pt would like referral to Dr Eulah Pont at [facility].

## 2011-05-27 NOTE — Telephone Encounter (Signed)
Pt called requesting results of MRI done 02/2011. Pt is also requesting referral to appropriate specialist. Pt states that leg pain is worse with numbness and weakness, please advise.  MRI printed and placed on MD's desk for review   

## 2011-05-27 NOTE — Telephone Encounter (Signed)
LS spine MRI essentially neg per radiology  I will refer to ortho due to pt symptoms

## 2011-05-27 NOTE — Telephone Encounter (Signed)
I will inform pcc

## 2011-09-17 ENCOUNTER — Ambulatory Visit
Admission: RE | Admit: 2011-09-17 | Discharge: 2011-09-17 | Disposition: A | Discharge status: Home / Self Care | Payer: No Typology Code available for payment source | Source: Ambulatory Visit | Attending: Physical Medicine and Rehabilitation | Admitting: Physical Medicine and Rehabilitation

## 2011-09-17 ENCOUNTER — Other Ambulatory Visit: Payer: Self-pay | Admitting: Physical Medicine and Rehabilitation

## 2011-09-17 DIAGNOSIS — M545 Low back pain: Secondary | ICD-10-CM

## 2011-10-09 ENCOUNTER — Encounter: Payer: Self-pay | Admitting: Internal Medicine

## 2011-10-12 ENCOUNTER — Encounter: Payer: Self-pay | Admitting: Internal Medicine

## 2011-10-12 DIAGNOSIS — Z0000 Encounter for general adult medical examination without abnormal findings: Secondary | ICD-10-CM | POA: Insufficient documentation

## 2011-10-16 ENCOUNTER — Ambulatory Visit (INDEPENDENT_AMBULATORY_CARE_PROVIDER_SITE_OTHER): Payer: BC Managed Care – PPO | Admitting: Internal Medicine

## 2011-10-16 ENCOUNTER — Encounter: Payer: Self-pay | Admitting: Internal Medicine

## 2011-10-16 ENCOUNTER — Other Ambulatory Visit: Payer: Self-pay | Admitting: Internal Medicine

## 2011-10-16 ENCOUNTER — Other Ambulatory Visit (INDEPENDENT_AMBULATORY_CARE_PROVIDER_SITE_OTHER): Payer: BC Managed Care – PPO

## 2011-10-16 VITALS — BP 122/72 | HR 67 | Temp 97.5°F | Ht 62.0 in | Wt 137.0 lb

## 2011-10-16 DIAGNOSIS — Z0000 Encounter for general adult medical examination without abnormal findings: Secondary | ICD-10-CM

## 2011-10-16 DIAGNOSIS — R209 Unspecified disturbances of skin sensation: Secondary | ICD-10-CM

## 2011-10-16 DIAGNOSIS — Z23 Encounter for immunization: Secondary | ICD-10-CM

## 2011-10-16 DIAGNOSIS — M545 Low back pain: Secondary | ICD-10-CM

## 2011-10-16 DIAGNOSIS — R202 Paresthesia of skin: Secondary | ICD-10-CM

## 2011-10-16 LAB — CBC WITH DIFFERENTIAL/PLATELET
Eosinophils Relative: 12.6 % — ABNORMAL HIGH (ref 0.0–5.0)
HCT: 41.4 % (ref 36.0–46.0)
Hemoglobin: 14.3 g/dL (ref 12.0–15.0)
Lymphocytes Relative: 27.1 % (ref 12.0–46.0)
Lymphs Abs: 2.3 10*3/uL (ref 0.7–4.0)
MCV: 86.2 fl (ref 78.0–100.0)
Monocytes Relative: 6 % (ref 3.0–12.0)
Neutrophils Relative %: 54 % (ref 43.0–77.0)
RBC: 4.8 Mil/uL (ref 3.87–5.11)

## 2011-10-16 LAB — URINALYSIS, ROUTINE W REFLEX MICROSCOPIC
Bilirubin Urine: NEGATIVE
Specific Gravity, Urine: <=1.005 (ref 1.000–1.030)
Total Protein, Urine: NEGATIVE
Urine Glucose: NEGATIVE
pH: 6 (ref 5.0–8.0)

## 2011-10-16 LAB — LIPID PANEL
Cholesterol: 258 mg/dL — ABNORMAL HIGH (ref 0–200)
HDL: 47.7 mg/dL (ref >39.00)
Triglycerides: 130 mg/dL (ref 0.0–149.0)
VLDL: 26 mg/dL (ref 0.0–40.0)

## 2011-10-16 LAB — BASIC METABOLIC PANEL
CO2: 33 mEq/L — ABNORMAL HIGH (ref 19–32)
Chloride: 96 mEq/L (ref 96–112)

## 2011-10-16 LAB — HEPATIC FUNCTION PANEL
ALT: 45 U/L — ABNORMAL HIGH (ref 0–35)
Bilirubin, Direct: 0.1 mg/dL (ref 0.0–0.3)
Total Bilirubin: 1.5 mg/dL — ABNORMAL HIGH (ref 0.3–1.2)
Total Protein: 7.4 g/dL (ref 6.0–8.3)

## 2011-10-16 LAB — TSH: TSH: 2.24 u[IU]/mL (ref 0.35–5.50)

## 2011-10-16 NOTE — Assessment & Plan Note (Signed)

## 2011-10-16 NOTE — Assessment & Plan Note (Signed)
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

## 2011-10-16 NOTE — Patient Instructions (Addendum)
You had the flu shot today Please contact your insurance to see if the shingles shot is covered; if it is, please make nurse visit for the shot Continue all other medications as before Please go to LAB in the Basement for the blood and/or urine tests to be done today Please call the phone number [phone] (the PhoneTree System) for results of testing in 2-3 days;  When calling, simply dial the number, and when prompted enter the MRN number above (the Medical Record Number) and the # key, then the message should start. Please return in 1 year for your yearly visit, or sooner if needed, with Lab testing done 3-5 days before

## 2011-10-16 NOTE — Progress Notes (Signed)
Subjective:    Patient ID: Sierra English, female    DOB: 07/16/1956, 55 y.o.   MRN: [id number]  HPI  Here for wellness and f/u;  Overall doing ok;  Pt denies CP, worsening SOB, DOE, wheezing, orthopnea, PND, worsening LE edema, palpitations, dizziness or syncope.  Pt denies neurological change such as new Headache, facial or extremity weakness.  Pt denies polydipsia, polyuria, or low sugar symptoms. Pt states overall good compliance with treatment and medications, good tolerability, and trying to follow lower cholesterol diet.  Pt denies worsening depressive symptoms, suicidal ideation or panic. No fever, wt loss, night sweats, loss of appetite, or other constitutional symptoms.  Pt states good ability with ADL's, low fall risk, home safety reviewed and adequate, no significant changes in hearing or vision, and occasionally active with exercise.  Does also have right LBP with some pain/numbness to right ant groin and thigh, but recent CT lumbar showed only mild facet dz.   Past Medical History  Diagnosis Date  . ALLERGIC RHINITIS 08/29/2010  . ANXIETY 08/29/2010  . CHEILITIS, ANGULAR 08/29/2010  . CHEST PAIN 01/29/2011  . COLONIC POLYPS, HX OF 08/29/2010  . HYPERTENSION 08/29/2010  . Low back pain 05/27/2011  . MACULAR DEGENERATION 08/29/2010  . PARESTHESIA 01/29/2011  . THORACIC/LUMBOSACRAL NEURITIS/RADICULITIS UNSPEC 02/28/2011   Past Surgical History  Procedure Date  . Cholecystectomy     reports that she has never smoked. She does not have any smokeless tobacco history on file. She reports that she does not drink alcohol or use illicit drugs. family history includes Arthritis in her mother; Breast cancer (age of onset:45) in her sister; Hypertension in her father; and Lung cancer in her father. No Known Allergies Current Outpatient Prescriptions on File Prior to Visit  Medication Sig Dispense Refill  . aspirin 81 MG tablet Take 81 mg by mouth daily.        . hydrochlorothiazide (HYDRODIURIL) 25 MG  tablet Take 25 mg by mouth daily.         Review of Systems Review of Systems  Constitutional: Negative for diaphoresis and unexpected weight change.  HENT: Negative for drooling and tinnitus.   Eyes: Negative for photophobia and visual disturbance.  Respiratory: Negative for choking and stridor.   Gastrointestinal: Negative for vomiting and blood in stool.  Genitourinary: Negative for hematuria and decreased urine volume.  Musculoskeletal: Negative for gait problem.  Skin: Negative for color change and wound.  Neurological: Negative for tremors and numbness.  Psychiatric/Behavioral: Negative for decreased concentration. The patient is not hyperactive.       Objective:   Physical Exam BP 122/72  Pulse 67  Temp(Src) 97.5 F (36.4 C) (Oral)  Ht 5\' 2"  (1.575 m)  Wt 137 lb (62.143 kg)  BMI 25.06 kg/m2  SpO2 98% Physical Exam  VS noted Constitutional: Pt is oriented to person, place, and time. Appears well-developed and well-nourished.  HENT:  Head: Normocephalic and atraumatic.  Right Ear: External ear normal.  Left Ear: External ear normal.  Nose: Nose normal.  Mouth/Throat: Oropharynx is clear and moist.  Eyes: Conjunctivae and EOM are normal. Pupils are equal, round, and reactive to light.  Neck: Normal range of motion. Neck supple. No JVD present. No tracheal deviation present.  Cardiovascular: Normal rate, regular rhythm, normal heart sounds and intact distal pulses.   Pulmonary/Chest: Effort normal and breath sounds normal.  Abdominal: Soft. Bowel sounds are normal. There is no tenderness.  Musculoskeletal: Normal range of motion. Exhibits no edema.  Lymphadenopathy:  Has no cervical adenopathy.  Neurological: Pt is alert and oriented to person, place, and time. Pt has normal reflexes. No cranial nerve deficit. Motor/sens/dtr/gait intact except ? Mild decr to right ant thigh Spine - nontender Skin: Skin is warm and dry. No rash noted.  Psychiatric:  Has  normal mood and  affect. Behavior is normal. 2+ nervous    Assessment & Plan:

## 2011-10-16 NOTE — Assessment & Plan Note (Signed)
?   Facet syndrome,  No clear radicular pain, exam o/w benign,  to f/u any worsening symptoms or concerns, declines neuro eval due to cost

## 2011-10-17 ENCOUNTER — Telehealth: Payer: Self-pay | Admitting: *Deleted

## 2011-10-17 DIAGNOSIS — Z79899 Other long term (current) drug therapy: Secondary | ICD-10-CM

## 2011-10-17 DIAGNOSIS — E7800 Pure hypercholesterolemia, unspecified: Secondary | ICD-10-CM

## 2011-10-17 NOTE — Telephone Encounter (Signed)
MD wanting pt to come back in 4 wks to have cholestrol check. Placing orders in EPIC...10/17/11@9 :37am/LMB

## 2011-10-30 ENCOUNTER — Other Ambulatory Visit: Payer: Self-pay

## 2011-10-30 DIAGNOSIS — R739 Hyperglycemia, unspecified: Secondary | ICD-10-CM

## 2011-11-03 ENCOUNTER — Telehealth: Payer: Self-pay

## 2011-11-03 NOTE — Telephone Encounter (Signed)
Patient called requesting recent lab results sent to her home. Called to inform will mail asap.

## 2011-11-12 ENCOUNTER — Telehealth: Payer: Self-pay | Admitting: Internal Medicine

## 2011-11-12 DIAGNOSIS — M542 Cervicalgia: Secondary | ICD-10-CM

## 2011-11-12 NOTE — Telephone Encounter (Signed)
The pt called and is hoping to get a referral to [facility] for neck pain.   She asked specifically for Dr.Willis.   Please advise if this is possible for her.   Thanks!!

## 2011-11-12 NOTE — Telephone Encounter (Signed)
Called the patient informed of referral. 

## 2011-11-12 NOTE — Telephone Encounter (Signed)
Ok for referral?

## 2011-11-29 ENCOUNTER — Other Ambulatory Visit: Payer: Self-pay | Admitting: Internal Medicine

## 2011-12-17 ENCOUNTER — Telehealth: Payer: Self-pay

## 2011-12-17 DIAGNOSIS — R002 Palpitations: Secondary | ICD-10-CM

## 2011-12-17 NOTE — Telephone Encounter (Signed)
Unfortunately I did not document anything to that effect  Can he state why he would like to be referred?

## 2011-12-17 NOTE — Telephone Encounter (Signed)
Ok for referral?

## 2011-12-17 NOTE — Telephone Encounter (Signed)
Pt is concerned about abnormal EKG last visit and also states that she sometimes feels her heart skipping beats.

## 2011-12-17 NOTE — Telephone Encounter (Signed)
Pt called requesting referral to Cardiology as discussed at last OV?

## 2012-01-02 ENCOUNTER — Telehealth: Payer: Self-pay | Admitting: Internal Medicine

## 2012-01-02 NOTE — Telephone Encounter (Signed)
I believe there was some d/w pt regarding medication, but no specfic medication was rx for this at last visit oct 2012

## 2012-01-02 NOTE — Telephone Encounter (Signed)
Pt desire med name Dr Jonny Ruiz prescribed her for leg numbness.

## 2012-01-02 NOTE — Telephone Encounter (Signed)
The patient stated she was given something to take for 7 days and was at her OV 7 to 8 months ago. Please advise

## 2012-01-02 NOTE — Telephone Encounter (Signed)
Sorry, I see no documentation of any med on the med list history or my office note for the 7 days she speaks of

## 2012-01-02 NOTE — Telephone Encounter (Signed)
The patient is requesting the name of the medication given to her for leg numbness to inform her neurologist. Please advise

## 2012-01-05 NOTE — Telephone Encounter (Signed)
Patient informed. 

## 2012-01-12 ENCOUNTER — Institutional Professional Consult (permissible substitution): Payer: BC Managed Care – PPO | Admitting: Cardiovascular Disease

## 2012-01-15 ENCOUNTER — Encounter: Payer: Self-pay | Admitting: Cardiology

## 2012-01-15 ENCOUNTER — Ambulatory Visit (INDEPENDENT_AMBULATORY_CARE_PROVIDER_SITE_OTHER): Payer: BC Managed Care – PPO | Admitting: Cardiology

## 2012-01-15 DIAGNOSIS — R9431 Abnormal electrocardiogram [ECG] [EKG]: Secondary | ICD-10-CM

## 2012-01-15 DIAGNOSIS — E785 Hyperlipidemia, unspecified: Secondary | ICD-10-CM

## 2012-01-15 DIAGNOSIS — R079 Chest pain, unspecified: Secondary | ICD-10-CM

## 2012-01-15 NOTE — Assessment & Plan Note (Signed)
The patient's EKG demonstrates RSR prime V1 and V2 but no significant abnormalities. This will be evaluated as above. We reviewed this at length.

## 2012-01-15 NOTE — Patient Instructions (Signed)
Your physician has requested that you have an exercise tolerance test. For further information please visit [email or web address] Please also follow instruction sheet, as given.  The current medical regimen is effective;  continue present plan and medications.  Your physician recommends that you return for a FASTING lipid profile: the same day as your stress testing.

## 2012-01-15 NOTE — Assessment & Plan Note (Signed)
I reviewed with her her LDL which was greater than 170 in September of last year. She says that since that time she has been dieting. She also had a mildly elevated blood sugar and is to follow with Dr. Jonny Ruiz. Since she has markedly changed her diet and has reduced her sugars she wants to have her cholesterol checked again and I will arrange this.

## 2012-01-15 NOTE — Assessment & Plan Note (Signed)
Her chest pain is somewhat atypical. However, even the burning nature at times with exercise stress testing is indicated. I will bring the patient back for a POET (Plain Old Exercise Test). This will allow me to screen for obstructive coronary disease, risk stratify and very importantly provide a prescription for exercise.

## 2012-01-15 NOTE — Progress Notes (Signed)
HPI The patient presents for evaluation of an abnormal EKG and chest discomfort.  She has no prior cardiac history. She has been noted on EKGs to have an RSR prime in V1 and V2 with some right axis deviation. This was confirmed on EKG today. She has been concerned about this. She has also been under a great deal of emotional stress since the death of her son last year. She says that she has noticed some chest discomfort. She describes multiple symptoms. Most concerning has been some burning discomfort when she would walk up an incline. She hasn't done this recently. She doesn't have this symptom at rest. She did have some sharp discomfort under her left breast done in her lower ribs last week. She had some arm tingling with this. This would, no air at rest. She has not been describing any jaw discomfort. She has not been describing any associated symptoms such as shortness of breath, PND or orthopnea. She's not had any associated nodular vomiting or diaphoresis. However, because of the chest discomfort and the abnormal EKG she's been quite concerned and is referred.  No Known Allergies  Current Outpatient Prescriptions  Medication Sig Dispense Refill  . aspirin 81 MG tablet Take 81 mg by mouth daily.        . cetirizine (ZYRTEC) 10 MG tablet Take 10 mg by mouth as needed.      . hydrochlorothiazide (HYDRODIURIL) 25 MG tablet TAKE ONE TABLET BY MOUTH EVERY DAY  90 tablet  3  . Multiple Vitamins-Minerals (EYE VITAMINS PO) Take by mouth daily.      . Nutritional Supplements (MELATONIN PO) Take by mouth daily.      Marland Kitchen VITAMIN D, CHOLECALCIFEROL, PO Take by mouth daily.        Past Medical History  Diagnosis Date  . ALLERGIC RHINITIS 08/29/2010  . ANXIETY 08/29/2010  . CHEILITIS, ANGULAR 08/29/2010  . CHEST PAIN 01/29/2011  . COLONIC POLYPS, HX OF 08/29/2010  . HYPERTENSION 08/29/2010  . Low back pain 05/27/2011  . MACULAR DEGENERATION 08/29/2010  . PARESTHESIA 01/29/2011  . THORACIC/LUMBOSACRAL  NEURITIS/RADICULITIS UNSPEC 02/28/2011    Past Surgical History  Procedure Date  . Cholecystectomy     Family History  Problem Relation Age of Onset  . Arthritis Mother   . Hypertension Father   . Lung cancer Father   . Breast cancer Sister 57    History   Social History  . Marital Status: Married    Spouse Name: N/A    Number of Children: 2  . Years of Education: N/A   Occupational History  . Not on file.   Social History Main Topics  . Smoking status: Never Smoker   . Smokeless tobacco: Not on file  . Alcohol Use: No  . Drug Use: No  . Sexually Active:    Other Topics Concern  . Not on file   Social History Narrative   Lives with husband and daughter.  From [place] originally.    ROS: Mild orthostatic dizziness. Otherwise as stated in the history of present illness and negative for all other systems.  PHYSICAL EXAM BP 130/80  Pulse 78  Ht 5\' 2"  (1.575 m)  Wt 134 lb (60.782 kg)  BMI 24.51 kg/m2 GENERAL:  Well appearing HEENT:  Pupils equal round and reactive, fundi not visualized, oral mucosa unremarkable NECK:  No jugular venous distention, waveform within normal limits, carotid upstroke brisk and symmetric, no bruits, no thyromegaly LYMPHATICS:  No cervical, inguinal adenopathy LUNGS:  Clear to auscultation bilaterally BACK:  No CVA tenderness CHEST:  Unremarkable HEART:  PMI not displaced or sustained,S1 and S2 within normal limits, no S3, no S4, no clicks, no rubs, no murmurs ABD:  Flat, positive bowel sounds normal in frequency in pitch, no bruits, no rebound, no guarding, no midline pulsatile mass, no hepatomegaly, no splenomegaly EXT:  2 plus pulses throughout, no edema, no cyanosis no clubbing SKIN:  No rashes no nodules NEURO:  Cranial nerves II through XII grossly intact, motor grossly intact throughout PSYCH:  Cognitively intact, oriented to person place and time   EKG:  Sinus rhythm, rate 78, rightward axis, RSR prime V1 and V2, QTC slightly  prolonged, nonspecific T-wave flattening.  ASSESSMENT AND PLAN

## 2012-02-09 ENCOUNTER — Telehealth: Payer: Self-pay

## 2012-02-09 DIAGNOSIS — N950 Postmenopausal bleeding: Secondary | ICD-10-CM

## 2012-02-09 NOTE — Telephone Encounter (Signed)
Pt called requesting referral to GYN, pt says she started what appears like a menstrual cycle 2 days ago although she has not had a period for 2 years. Please advise, OV with JWJ first?

## 2012-02-09 NOTE — Telephone Encounter (Signed)
Called to inform.  The patient stated she has a GYN and will call herself to schedule appointment. Please cancel referral.

## 2012-02-09 NOTE — Telephone Encounter (Signed)
Ok for referral for postmenopausal bleeding- to GYN, done per emr

## 2012-02-13 ENCOUNTER — Other Ambulatory Visit: Payer: Self-pay | Admitting: Obstetrics & Gynecology

## 2012-02-13 DIAGNOSIS — N950 Postmenopausal bleeding: Secondary | ICD-10-CM

## 2012-02-18 ENCOUNTER — Ambulatory Visit
Admission: RE | Admit: 2012-02-18 | Discharge: 2012-02-18 | Disposition: A | Discharge status: Home / Self Care | Payer: BC Managed Care – PPO | Source: Ambulatory Visit | Attending: Obstetrics & Gynecology | Admitting: Obstetrics & Gynecology

## 2012-02-18 ENCOUNTER — Other Ambulatory Visit: Payer: BC Managed Care – PPO

## 2012-02-18 DIAGNOSIS — N950 Postmenopausal bleeding: Secondary | ICD-10-CM

## 2012-02-18 MED ORDER — IOHEXOL 300 MG/ML  SOLN
100.0000 mL | Freq: Once | INTRAMUSCULAR | Status: AC | PRN
  Administered 2012-02-18: 100 mL via INTRAVENOUS

## 2012-04-10 ENCOUNTER — Encounter: Payer: Self-pay | Admitting: Family Medicine

## 2012-04-10 ENCOUNTER — Ambulatory Visit (INDEPENDENT_AMBULATORY_CARE_PROVIDER_SITE_OTHER): Payer: BC Managed Care – PPO | Admitting: Family Medicine

## 2012-04-10 VITALS — BP 128/78 | HR 101 | Temp 98.3°F | Wt 131.0 lb

## 2012-04-10 DIAGNOSIS — H1010 Acute atopic conjunctivitis, unspecified eye: Secondary | ICD-10-CM

## 2012-04-10 MED ORDER — METHYLPREDNISOLONE ACETATE 80 MG/ML IJ SUSP
80.0000 mg | Freq: Once | INTRAMUSCULAR | Status: AC
  Administered 2012-04-10: 80 mg via INTRAMUSCULAR

## 2012-04-10 NOTE — Progress Notes (Signed)
Addended byRosalio Macadamia, Donnae Michels B on: 04/10/2012 12:24 PM   Modules accepted: Orders

## 2012-04-10 NOTE — Progress Notes (Signed)
  Patient Name: Sierra English Date of Birth: 1956-01-08 Age: 56 y.o. Medical Record Number: [id number] Gender: female Date of Encounter: 04/10/2012  History of Present Illness:  Sierra English is a 56 y.o. very pleasant female patient who presents with the following:  Nose, eyes, lungs -- any medication, multiple anti-H, pseudophed, eye drops and nasal steroids have not been helping. 2-3 days the worst  Depo-medrol 80 mg IM  Past Medical History, Surgical History, Social History, Family History, Problem List, Medications, and Allergies have been reviewed and updated if relevant.  Review of Systems: ROS: GEN: Acute details above GI: Tolerating PO intake GU: maintaining adequate hydration and urination Pulm: No SOB Interactive and getting along well at home.  Otherwise, ROS is as per the HPI.   Physical Examination: Filed Vitals:   04/10/12 0918  BP: 128/78  Pulse: 101  Temp: 98.3 F (36.8 C)  TempSrc: Oral  Weight: 131 lb (59.421 kg)  SpO2: 97%    There is no height on file to calculate BMI.   GEN: WDWN, NAD, Non-toxic, Alert & Oriented x 3 HEENT: Atraumatic, Normocephalic. Injected conj, swollen turbinates Ears and Nose: No external deformity. EXTR: No clubbing/cyanosis/edema NEURO: Normal gait.  PSYCH: Normally interactive. Conversant. Not depressed or anxious appearing.  Calm demeanor.    Assessment and Plan:   1. Allergic conjunctivitis and rhinitis    Depomedrol 80 mg now  Cont anti-H and nasal sprays

## 2012-10-19 ENCOUNTER — Encounter: Payer: Self-pay | Admitting: Internal Medicine

## 2012-10-19 ENCOUNTER — Other Ambulatory Visit (INDEPENDENT_AMBULATORY_CARE_PROVIDER_SITE_OTHER): Payer: BC Managed Care – PPO

## 2012-10-19 ENCOUNTER — Ambulatory Visit (INDEPENDENT_AMBULATORY_CARE_PROVIDER_SITE_OTHER): Payer: BC Managed Care – PPO | Admitting: Internal Medicine

## 2012-10-19 ENCOUNTER — Ambulatory Visit (INDEPENDENT_AMBULATORY_CARE_PROVIDER_SITE_OTHER)
Admission: RE | Admit: 2012-10-19 | Discharge: 2012-10-19 | Disposition: A | Discharge status: Home / Self Care | Payer: BC Managed Care – PPO | Source: Ambulatory Visit | Attending: Internal Medicine | Admitting: Internal Medicine

## 2012-10-19 VITALS — BP 130/78 | HR 67 | Temp 97.9°F | Ht 62.0 in | Wt 131.2 lb

## 2012-10-19 DIAGNOSIS — J45909 Unspecified asthma, uncomplicated: Secondary | ICD-10-CM

## 2012-10-19 DIAGNOSIS — Z0000 Encounter for general adult medical examination without abnormal findings: Secondary | ICD-10-CM

## 2012-10-19 DIAGNOSIS — R079 Chest pain, unspecified: Secondary | ICD-10-CM

## 2012-10-19 DIAGNOSIS — I10 Essential (primary) hypertension: Secondary | ICD-10-CM

## 2012-10-19 DIAGNOSIS — R7309 Other abnormal glucose: Secondary | ICD-10-CM

## 2012-10-19 DIAGNOSIS — R21 Rash and other nonspecific skin eruption: Secondary | ICD-10-CM | POA: Insufficient documentation

## 2012-10-19 DIAGNOSIS — E785 Hyperlipidemia, unspecified: Secondary | ICD-10-CM

## 2012-10-19 DIAGNOSIS — R7302 Impaired glucose tolerance (oral): Secondary | ICD-10-CM

## 2012-10-19 DIAGNOSIS — Z23 Encounter for immunization: Secondary | ICD-10-CM

## 2012-10-19 DIAGNOSIS — J309 Allergic rhinitis, unspecified: Secondary | ICD-10-CM

## 2012-10-19 LAB — CBC WITH DIFFERENTIAL/PLATELET
Basophils Relative: 0.7 % (ref 0.0–3.0)
Eosinophils Absolute: 0.3 10*3/uL (ref 0.0–0.7)
HCT: 43.1 % (ref 36.0–46.0)
Hemoglobin: 14.2 g/dL (ref 12.0–15.0)
Lymphs Abs: 2.1 10*3/uL (ref 0.7–4.0)
MCHC: 33.1 g/dL (ref 30.0–36.0)
MCV: 84.9 fl (ref 78.0–100.0)
Monocytes Absolute: 0.6 10*3/uL (ref 0.1–1.0)
Neutro Abs: 3.8 10*3/uL (ref 1.4–7.7)
Neutrophils Relative %: 55.5 % (ref 43.0–77.0)
RBC: 5.07 Mil/uL (ref 3.87–5.11)

## 2012-10-19 LAB — LIPID PANEL
HDL: 39.8 mg/dL (ref >39.00)
Total CHOL/HDL Ratio: 6

## 2012-10-19 LAB — URINALYSIS, ROUTINE W REFLEX MICROSCOPIC
Bilirubin Urine: NEGATIVE
Hgb urine dipstick: NEGATIVE
Total Protein, Urine: NEGATIVE
Urine Glucose: NEGATIVE
pH: 6.5 (ref 5.0–8.0)

## 2012-10-19 LAB — HEPATIC FUNCTION PANEL
ALT: 28 U/L (ref 0–35)
Bilirubin, Direct: 0.2 mg/dL (ref 0.0–0.3)
Total Bilirubin: 1.3 mg/dL — ABNORMAL HIGH (ref 0.3–1.2)

## 2012-10-19 LAB — BASIC METABOLIC PANEL
BUN: 9 mg/dL (ref 6–23)
CO2: 32 mEq/L (ref 19–32)
Chloride: 99 mEq/L (ref 96–112)
Creatinine, Ser: 0.7 mg/dL (ref 0.4–1.2)
Potassium: 3.5 mEq/L (ref 3.5–5.1)

## 2012-10-19 MED ORDER — CETIRIZINE HCL 10 MG PO TABS: 10.0000 mg | ORAL_TABLET | Freq: Every day | ORAL | Status: DC | PRN

## 2012-10-19 MED ORDER — HYDROCHLOROTHIAZIDE 25 MG PO TABS: 25.0000 mg | ORAL_TABLET | Freq: Every day | ORAL | Status: DC

## 2012-10-19 MED ORDER — TRIAMCINOLONE ACETONIDE 0.1 % EX CREA: TOPICAL_CREAM | Freq: Two times a day (BID) | CUTANEOUS | Status: DC

## 2012-10-19 MED ORDER — ALBUTEROL SULFATE HFA 108 (90 BASE) MCG/ACT IN AERS: 2.0000 | INHALATION_SPRAY | Freq: Four times a day (QID) | RESPIRATORY_TRACT | Status: DC | PRN

## 2012-10-19 MED ORDER — BECLOMETHASONE DIPROPIONATE 40 MCG/ACT IN AERS: 2.0000 | INHALATION_SPRAY | Freq: Two times a day (BID) | RESPIRATORY_TRACT | Status: DC

## 2012-10-19 NOTE — Assessment & Plan Note (Signed)
Mild nasal irritated dermatitis -for triam cr, consider derm referral

## 2012-10-19 NOTE — Assessment & Plan Note (Signed)
ECG reviewed as per emr, declines stress test due to cost, for cxr today

## 2012-10-19 NOTE — Assessment & Plan Note (Signed)

## 2012-10-19 NOTE — Patient Instructions (Addendum)
Take all new medications as prescribed - see the medication list Continue all other medications as before; all of your medications were filled to the pharmacy today You are given the proair HFA and QVAR inhaler to use only during the times of allergy/asthma flare up of symptoms Your EKG was OK today Please go to XRAY in the Basement for the x-ray test Please go to LAB in the Basement for the blood and/or urine tests to be done today You will be contacted by phone if any changes need to be made immediately.  Otherwise, you will receive a letter about your results with an explanation. Please remember to sign up for My Chart at your earliest convenience, as this will be important to you in the future with finding out test results. Please call if you change your mind about having the stress test ordered You had the flu shot today Please return in 1 year for your yearly visit, or sooner if needed, with Lab testing done 3-5 days before

## 2012-10-20 LAB — LDL CHOLESTEROL, DIRECT: Direct LDL: 162.2 mg/dL

## 2012-10-21 ENCOUNTER — Encounter: Payer: Self-pay | Admitting: Internal Medicine

## 2012-10-21 ENCOUNTER — Other Ambulatory Visit: Payer: Self-pay | Admitting: Internal Medicine

## 2012-10-21 MED ORDER — ATORVASTATIN CALCIUM 10 MG PO TABS: 10.0000 mg | ORAL_TABLET | Freq: Every day | ORAL | Status: DC

## 2012-10-24 ENCOUNTER — Encounter: Payer: Self-pay | Admitting: Internal Medicine

## 2012-10-24 DIAGNOSIS — J4520 Mild intermittent asthma, uncomplicated: Secondary | ICD-10-CM | POA: Insufficient documentation

## 2012-10-24 DIAGNOSIS — R079 Chest pain, unspecified: Secondary | ICD-10-CM | POA: Insufficient documentation

## 2012-10-24 NOTE — Assessment & Plan Note (Signed)
Mild, for a1c today,  to f/u any worsening symptoms or concerns

## 2012-10-24 NOTE — Assessment & Plan Note (Signed)
stable overall by hx and exam, most recent data reviewed with pt, and pt to continue medical treatment as before BP Readings from Last 3 Encounters:  10/19/12 130/78  04/10/12 128/78  01/15/12 130/80

## 2012-10-24 NOTE — Assessment & Plan Note (Signed)
For zyrtec qd prn,  to f/u any worsening symptoms or concerns

## 2012-10-24 NOTE — Progress Notes (Signed)
Subjective:    Patient ID: Sierra English, female    DOB: May 09, 1956, 56 y.o.   MRN: [id number]  HPI  Here for wellness and f/u;  Overall doing ok;  Pt denies worsening SOB, DOE, wheezing, orthopnea, PND, worsening LE edema, palpitations, dizziness or syncope but has had mild vague anterior dull chest pain, nonpleuritic nonexertional for 1-2 mo, also 3 episode in the past yr of several days midl wheezing/sob recurrent without fever or prod cough. .  Pt denies neurological change such as new Headache, facial or extremity weakness.  Pt denies polydipsia, polyuria, or low sugar symptoms. Pt states overall good compliance with treatment and medications, good tolerability, and trying to follow lower cholesterol diet.  Pt denies worsening depressive symptoms, suicidal ideation or panic. No fever, wt loss, night sweats, loss of appetite, or other constitutional symptoms.  Pt states good ability with ADL's, low fall risk, home safety reviewed and adequate, no significant changes in hearing or vision, and occasionally active with exercise.  Does have several wks ongoing nasal allergy symptoms with clear congestion, itch and sneeze. Past Medical History  Diagnosis Date  . ALLERGIC RHINITIS 08/29/2010  . ANXIETY 08/29/2010  . CHEILITIS, ANGULAR 08/29/2010  . CHEST PAIN 01/29/2011  . COLONIC POLYPS, HX OF 08/29/2010  . HYPERTENSION 08/29/2010  . Low back pain 05/27/2011  . MACULAR DEGENERATION 08/29/2010  . PARESTHESIA 01/29/2011  . THORACIC/LUMBOSACRAL NEURITIS/RADICULITIS UNSPEC 02/28/2011   Past Surgical History  Procedure Date  . Cholecystectomy     reports that she has never smoked. She does not have any smokeless tobacco history on file. She reports that she does not drink alcohol or use illicit drugs. family history includes Arthritis in her mother; Breast cancer (age of onset:45) in her sister; Hypertension in her father; and Lung cancer in her father. No Known Allergies Current Outpatient Prescriptions on  File Prior to Visit  Medication Sig Dispense Refill  . cetirizine (ZYRTEC) 10 MG tablet Take 1 tablet (10 mg total) by mouth daily as needed.  90 tablet  3  . hydrochlorothiazide (HYDRODIURIL) 25 MG tablet Take 1 tablet (25 mg total) by mouth daily.  90 tablet  3  . Multiple Vitamins-Minerals (EYE VITAMINS PO) Take by mouth daily.      . Nutritional Supplements (MELATONIN PO) Take by mouth daily.      Marland Kitchen VITAMIN D, CHOLECALCIFEROL, PO Take by mouth daily.      Marland Kitchen albuterol (PROVENTIL HFA;VENTOLIN HFA) 108 (90 BASE) MCG/ACT inhaler Inhale 2 puffs into the lungs every 6 (six) hours as needed for wheezing.  1 Inhaler  11  . aspirin 81 MG tablet Take 81 mg by mouth daily.        Marland Kitchen atorvastatin (LIPITOR) 10 MG tablet Take 1 tablet (10 mg total) by mouth daily.  90 tablet  3  . beclomethasone (QVAR) 40 MCG/ACT inhaler Inhale 2 puffs into the lungs 2 (two) times daily.  1 Inhaler  12   Review of Systems Review of Systems  Constitutional: Negative for diaphoresis, activity change, appetite change and unexpected weight change.  HENT: Negative for hearing loss, ear pain, facial swelling, mouth sores and neck stiffness.   Eyes: Negative for pain, redness and visual disturbance.  Respiratory: Negative for shortness of breath and wheezing.   Cardiovascular: Negative for  palpitations.  Gastrointestinal: Negative for diarrhea, blood in stool, abdominal distention and rectal pain.  Genitourinary: Negative for hematuria, flank pain and decreased urine volume.  Musculoskeletal: Negative for myalgias and joint swelling.  Skin: Negative for color change and wound.  Neurological: Negative for syncope and numbness.  Hematological: Negative for adenopathy.  Psychiatric/Behavioral: Negative for hallucinations, self-injury, decreased concentration and agitation.      Objective:   Physical Exam BP 130/78  Pulse 67  Temp 97.9 F (36.6 C) (Oral)  Ht 5\' 2"  (1.575 m)  Wt 131 lb 4 oz (59.535 kg)  BMI 24.01 kg/m2   SpO2 97% Physical Exam  VS noted Constitutional: Pt is oriented to person, place, and time. Appears well-developed and well-nourished.  HENT:  Head: Normocephalic and atraumatic.  Right Ear: External ear normal.  Left Ear: External ear normal.  Nose: Nose normal.  Mouth/Throat: Oropharynx is clear and moist.  Bilat tm's mild erythema.  Sinus nontender.  Pharynx mild erythema Eyes: Conjunctivae and EOM are normal. Pupils are equal, round, and reactive to light.  Neck: Normal range of motion. Neck supple. No JVD present. No tracheal deviation present.  Cardiovascular: Normal rate, regular rhythm, normal heart sounds and intact distal pulses.   Pulmonary/Chest: Effort normal and breath sounds normal.  Abdominal: Soft. Bowel sounds are normal. There is no tenderness.  Musculoskeletal: Normal range of motion. Exhibits no edema.  Lymphadenopathy:  Has no cervical adenopathy.  Neurological: Pt is alert and oriented to person, place, and time. Pt has normal reflexes. No cranial nerve deficit.  Skin: Skin is warm and dry. No rash noted. except for mild erythem rash to nasal area, nontender, 1 cm area bilat with mild scaling Psychiatric:  Has  normal mood and affect. Behavior is normal.     Assessment & Plan:

## 2012-10-24 NOTE — Assessment & Plan Note (Signed)
Atypical, ECG reviewed as per emr, delcines stress test I think due to large deductible insurance

## 2012-10-24 NOTE — Assessment & Plan Note (Signed)
Mild intermittent by hx - for albuterol refill, add qvar asd, gave sample today

## 2012-12-09 ENCOUNTER — Encounter: Payer: Self-pay | Admitting: Internal Medicine

## 2012-12-10 LAB — HM MAMMOGRAPHY

## 2013-10-27 ENCOUNTER — Telehealth: Payer: Self-pay | Admitting: Neurology

## 2013-10-27 IMAGING — CR DG CHEST 2V
2 series · 2 of 2 positions shown · non-contrast
Comparison: None.

CLINICAL DATA: Recurrent left chest pain.  History of hypertension.

CHEST - 2 VIEW

[view not recorded (1 of 2)]
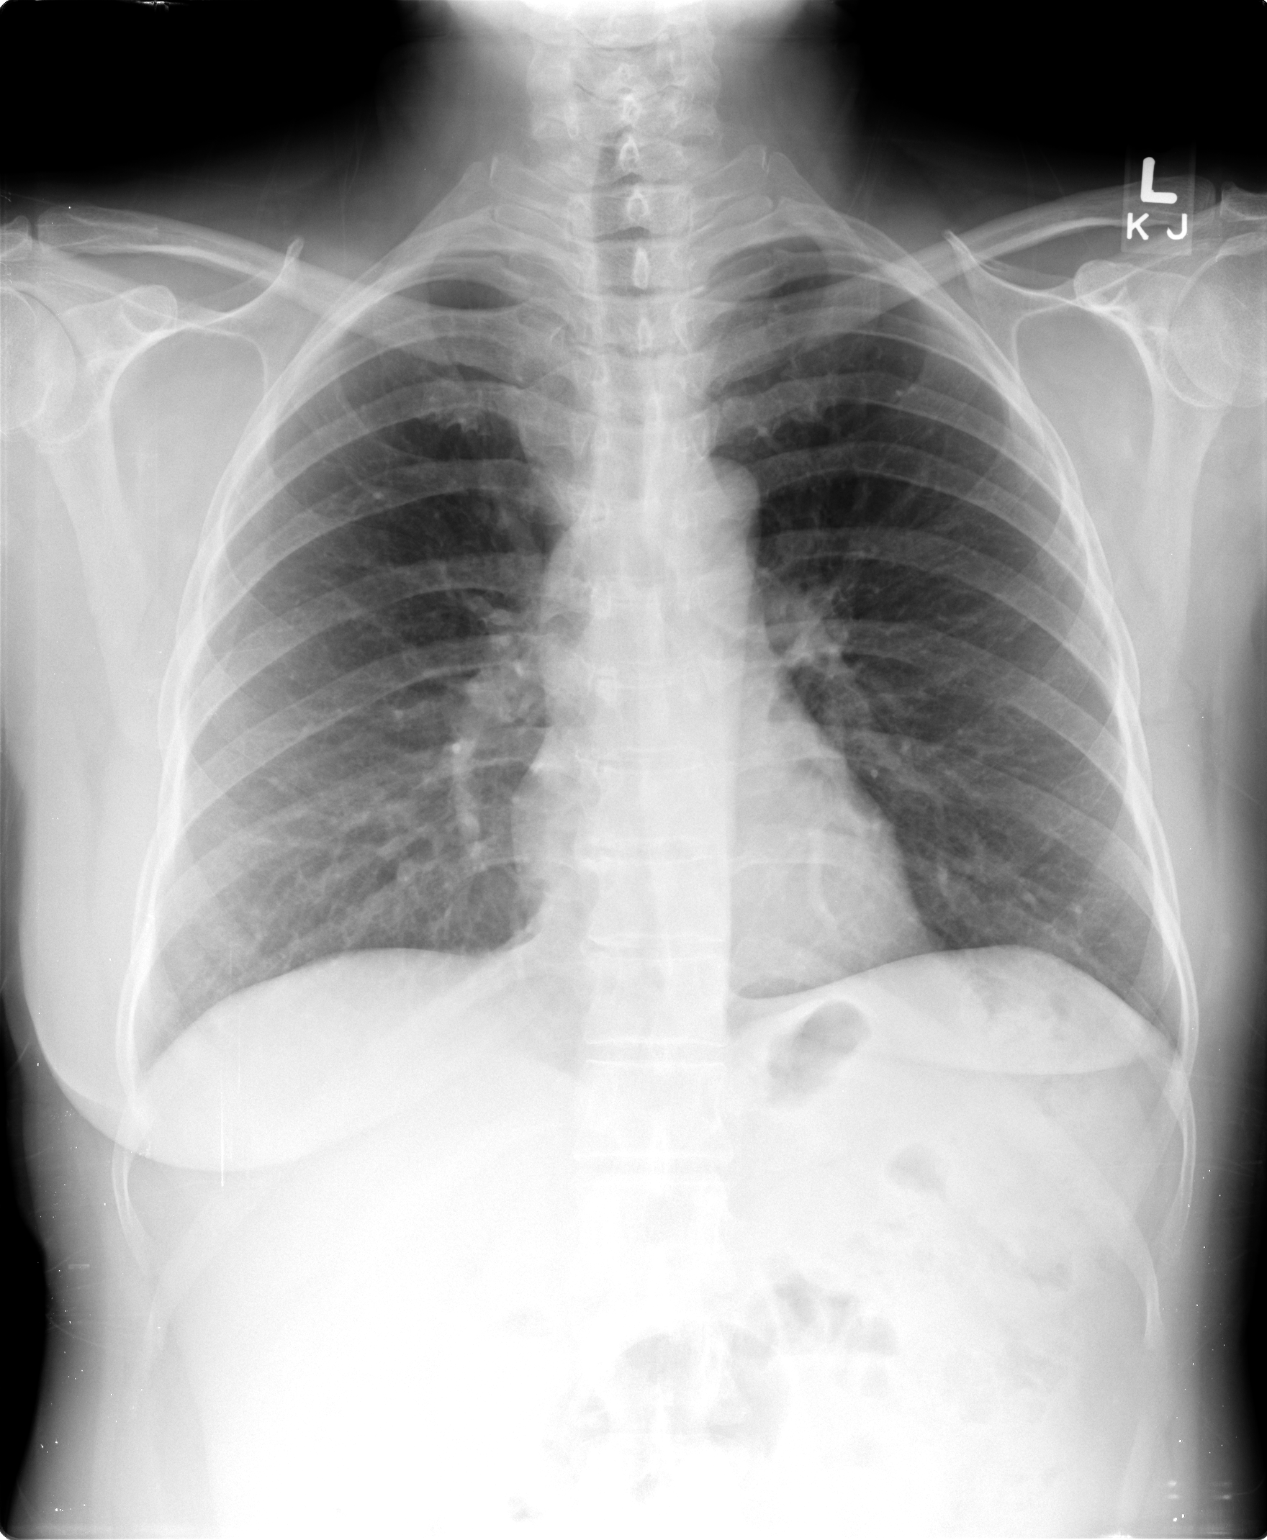

[view not recorded (2 of 2)]
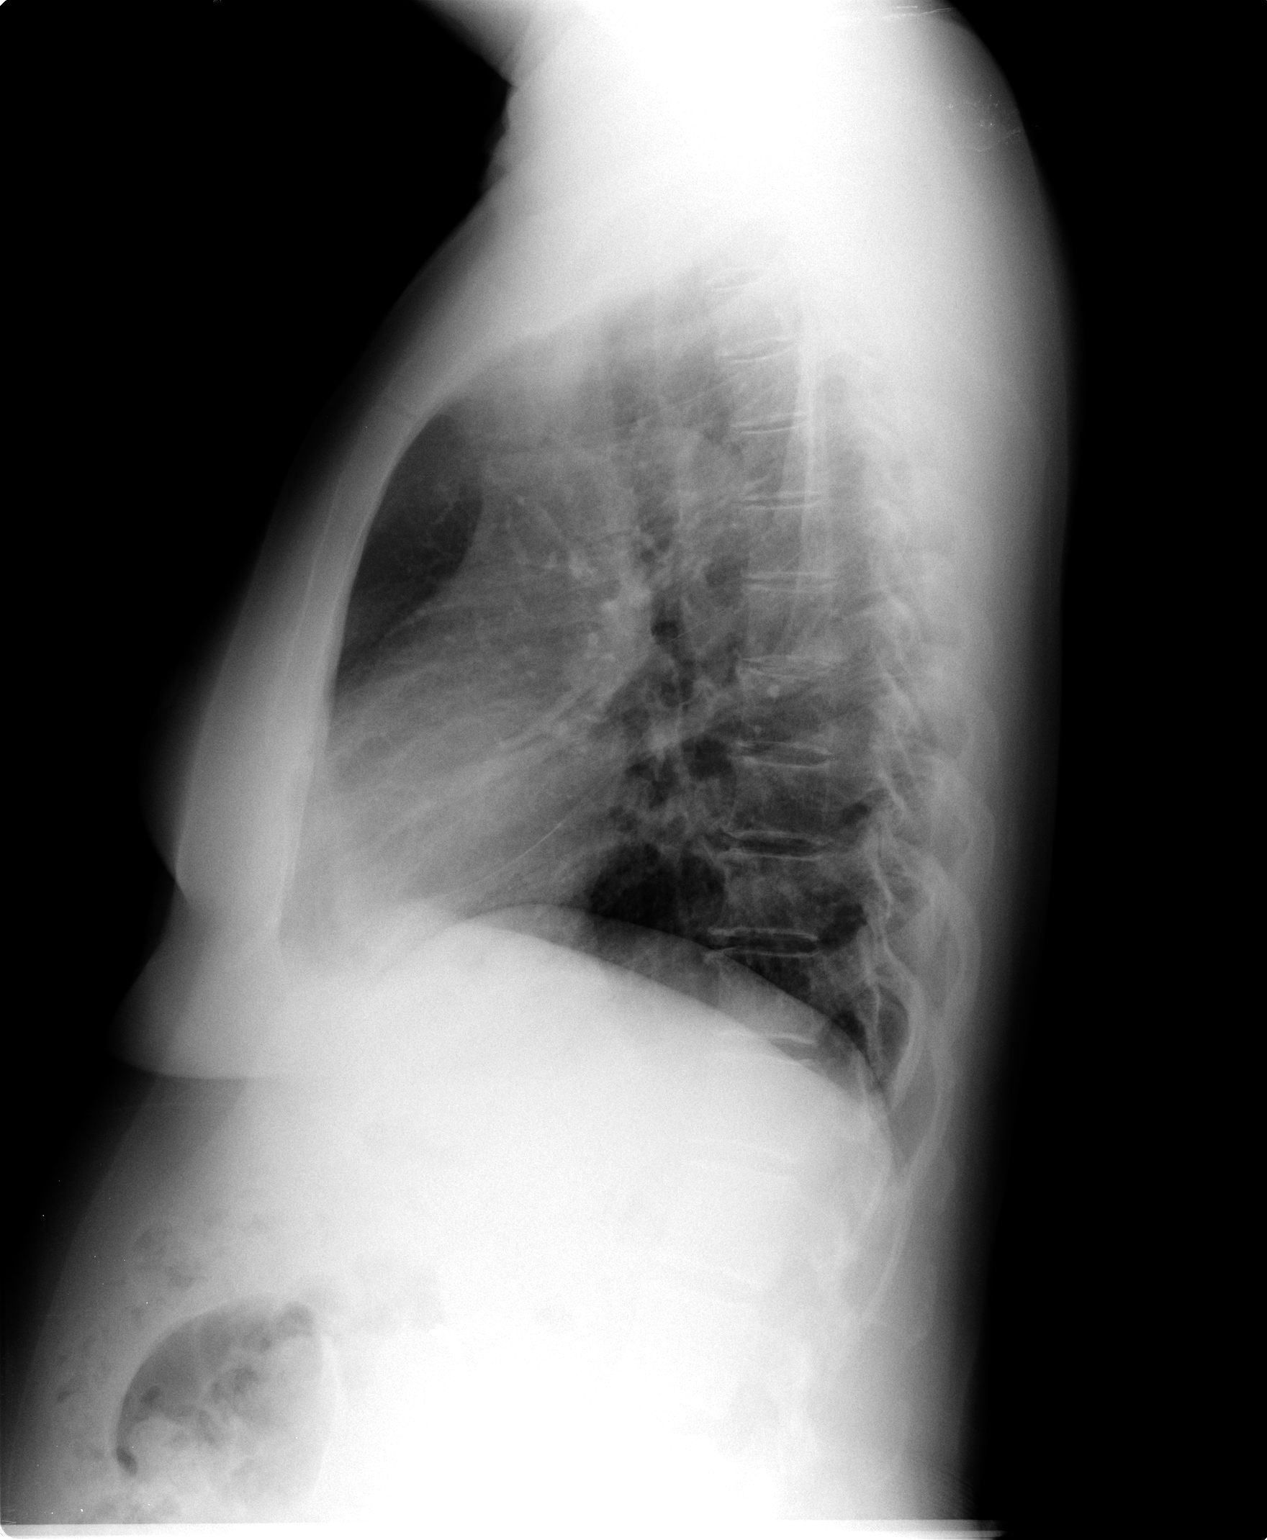

[2 of 2 positions shown; findings below may reference images not displayed]

FINDINGS: The heart size and mediastinal contours are normal. The
lungs are clear. There is no pleural effusion or pneumothorax. No
acute osseous findings are identified.  Cholecystectomy clips are
noted.
IMPRESSION: No active cardiopulmonary process.

## 2013-10-27 NOTE — Telephone Encounter (Signed)
I spoke to patient who wanted to know what her diagnosis was and what exercises were safe to do.  The patient was only seen once on 01-16-12.  I told her the dx in her chart but I was not able to recommend exercises.  She was being very persistent, and I explained to her that she would have to be seen before any recommendations were made.  She will not have insurance for a few months, so she doesn't want to come until her insurance is effective.  She still wanted a message left for the doctor.  [phone]

## 2013-10-28 ENCOUNTER — Telehealth: Payer: Self-pay | Admitting: *Deleted

## 2013-10-28 ENCOUNTER — Telehealth: Payer: Self-pay | Admitting: Neurology

## 2013-10-28 NOTE — Telephone Encounter (Signed)
I called the patient. The patient was seen in January 2013 with a right sided meralgia paresthetica. The patient is minimally obese, but some weight loss may help, and avoiding closing it is too tight at the belt line. If the patient has not been worked up for other entities such as diabetes, this should be done. There are no restrictions and exercise. This is a benign condition, not one that would lead to disability.

## 2013-10-28 NOTE — Telephone Encounter (Signed)
The patient called back after I left a message. The patient indicates that she is having burning sensations on the right thigh. The patient wants medications for this, but she has not been seen through this office since January 2013. I'll get a revisit set up for her.

## 2013-11-14 NOTE — Telephone Encounter (Signed)
Patient will call back after ins change. Current ins is out of network and she can't afford self pay visit.

## 2014-01-02 ENCOUNTER — Encounter: Payer: Self-pay | Admitting: Cardiology

## 2014-01-02 ENCOUNTER — Ambulatory Visit (INDEPENDENT_AMBULATORY_CARE_PROVIDER_SITE_OTHER): Payer: BC Managed Care – PPO | Admitting: Cardiology

## 2014-01-02 ENCOUNTER — Encounter (INDEPENDENT_AMBULATORY_CARE_PROVIDER_SITE_OTHER): Payer: Self-pay

## 2014-01-02 VITALS — BP 130/80 | HR 82 | Ht 62.0 in | Wt 135.0 lb

## 2014-01-02 DIAGNOSIS — I10 Essential (primary) hypertension: Secondary | ICD-10-CM

## 2014-01-02 DIAGNOSIS — R079 Chest pain, unspecified: Secondary | ICD-10-CM

## 2014-01-02 NOTE — Patient Instructions (Signed)
Your physician has requested that you have an exercise tolerance test.  Please also follow instruction sheet, as given.  Your physician recommends that you schedule a follow-up appointment in: AS NEEDED PER RESULTS

## 2014-01-02 NOTE — Progress Notes (Addendum)
HPI The patient presents for evaluation of an abnormal EKG and chest discomfort.  She has no prior cardiac history. She has been noted on EKGs to have an RSR prime in V1 and V2 with some right axis deviation.  I saw her couple of years ago for chest discomfort. She was supposed to have an exercise treadmill at bedtime but she did not schedule this because of cost. She continues to be under emotional stress since her son was murdered about 3 years ago. She describes multiple aches and pains sometimes with exercise. However, they're not always reproducible. She gets some pains that start in her back and moves around. She has some neck pain. She doesn't describe any classic substernal pressure or radiation to her jaw or associated symptoms such as nausea vomiting or diaphoresis. She doesn't really notice any palpitations, presyncope or syncope.  She does not have PND or orthopnea. No Known Allergies  Current Outpatient Prescriptions  Medication Sig Dispense Refill  . albuterol (PROVENTIL HFA;VENTOLIN HFA) 108 (90 BASE) MCG/ACT inhaler Inhale 2 puffs into the lungs every 6 (six) hours as needed for wheezing.  1 Inhaler  11  . Black Cohosh 175 MG CAPS Take 1 capsule by mouth daily. menopause      . cetirizine (ZYRTEC) 10 MG tablet Take 1 tablet (10 mg total) by mouth daily as needed.  90 tablet  3  . clonazePAM (KLONOPIN) 0.5 MG tablet Take 0.5 mg by mouth 2 (two) times daily as needed for anxiety.      . Coenzyme Q10 (COQ-10) 200 MG CAPS Take 1 capsule by mouth every morning.      . Multiple Vitamins-Minerals (EYE VITAMINS PO) Take by mouth daily.      Marland Kitchen VITAMIN D, CHOLECALCIFEROL, PO Take by mouth daily.       No current facility-administered medications for this visit.    Past Medical History  Diagnosis Date  . ALLERGIC RHINITIS 08/29/2010  . ANXIETY 08/29/2010  . [place], [place] 08/29/2010  . CHEST PAIN 01/29/2011  . COLONIC POLYPS, HX OF 08/29/2010  . HYPERTENSION 08/29/2010  . Low back pain  05/27/2011  . MACULAR DEGENERATION 08/29/2010  . PARESTHESIA 01/29/2011  . THORACIC/LUMBOSACRAL NEURITIS/RADICULITIS UNSPEC 02/28/2011    Past Surgical History  Procedure Laterality Date  . Cholecystectomy      ROS: Mild orthostatic dizziness. Otherwise as stated in the history of present illness and negative for all other systems.  PHYSICAL EXAM BP 130/80  Pulse 82  Ht 5\' 2"  (1.575 m)  Wt 135 lb (61.236 kg)  BMI 24.69 kg/m2 GENERAL:  Well appearing HEENT:  Pupils equal round and reactive, fundi not visualized, oral mucosa unremarkable NECK:  No jugular venous distention, waveform within normal limits, carotid upstroke brisk and symmetric, no bruits, no thyromegaly LYMPHATICS:  No cervical, inguinal adenopathy LUNGS:  Clear to auscultation bilaterally BACK:  No CVA tenderness CHEST:  Unremarkable HEART:  PMI not displaced or sustained,S1 and S2 within normal limits, no S3, no S4, no clicks, no rubs, no murmurs ABD:  Flat, positive bowel sounds normal in frequency in pitch, no bruits, no rebound, no guarding, no midline pulsatile mass, no hepatomegaly, no splenomegaly EXT:  2 plus pulses throughout, no edema, no cyanosis no clubbing SKIN:  No rashes no nodules NEURO:  Cranial nerves II through XII grossly intact, motor grossly intact throughout PSYCH:  Cognitively intact, oriented to person place and time   EKG:  Sinus rhythm, rate 82, rightward axis, RSR prime V1 and  V2, QTC slightly prolonged, nonspecific T-wave flattening.  01/02/2014   ASSESSMENT AND PLAN  CHEST PAIN:  This is atypical.  I will bring the patient back for a POET (Plain Old Exercise Test). This will allow me to screen for obstructive coronary disease, risk stratify and very importantly provide a prescription for exercise.  DYSLIPIDEMIA:  Last LDL in our system was 162. However, she says that since then she has dieted and down nicely. This is followed by her primary provider.

## 2014-01-06 ENCOUNTER — Encounter (HOSPITAL_COMMUNITY): Payer: BC Managed Care – PPO

## 2014-01-13 ENCOUNTER — Other Ambulatory Visit: Payer: Self-pay

## 2014-01-13 ENCOUNTER — Ambulatory Visit (HOSPITAL_COMMUNITY)
Admission: RE | Admit: 2014-01-13 | Discharge: 2014-01-13 | Disposition: A | Discharge status: Home / Self Care | Payer: BC Managed Care – PPO | Source: Ambulatory Visit | Attending: Cardiovascular Disease | Admitting: Cardiovascular Disease

## 2014-01-13 DIAGNOSIS — R079 Chest pain, unspecified: Secondary | ICD-10-CM | POA: Insufficient documentation

## 2014-01-13 DIAGNOSIS — I10 Essential (primary) hypertension: Secondary | ICD-10-CM

## 2014-01-18 ENCOUNTER — Telehealth: Payer: Self-pay | Admitting: Cardiology

## 2014-01-18 NOTE — Telephone Encounter (Signed)
Pt aware GXT was normal

## 2014-01-18 NOTE — Telephone Encounter (Signed)
New Message  Pt called for GXT results

## 2014-03-28 ENCOUNTER — Ambulatory Visit (INDEPENDENT_AMBULATORY_CARE_PROVIDER_SITE_OTHER): Payer: BC Managed Care – PPO | Admitting: Internal Medicine

## 2014-03-28 ENCOUNTER — Encounter: Payer: Self-pay | Admitting: Internal Medicine

## 2014-03-28 VITALS — BP 130/82 | HR 79 | Temp 98.7°F | Ht 62.0 in | Wt 134.2 lb

## 2014-03-28 DIAGNOSIS — F4329 Adjustment disorder with other symptoms: Secondary | ICD-10-CM

## 2014-03-28 DIAGNOSIS — R7302 Impaired glucose tolerance (oral): Secondary | ICD-10-CM

## 2014-03-28 DIAGNOSIS — Z0000 Encounter for general adult medical examination without abnormal findings: Secondary | ICD-10-CM

## 2014-03-28 DIAGNOSIS — I10 Essential (primary) hypertension: Secondary | ICD-10-CM

## 2014-03-28 DIAGNOSIS — R7309 Other abnormal glucose: Secondary | ICD-10-CM

## 2014-03-28 DIAGNOSIS — E538 Deficiency of other specified B group vitamins: Secondary | ICD-10-CM

## 2014-03-28 DIAGNOSIS — J309 Allergic rhinitis, unspecified: Secondary | ICD-10-CM

## 2014-03-28 DIAGNOSIS — F329 Major depressive disorder, single episode, unspecified: Secondary | ICD-10-CM

## 2014-03-28 DIAGNOSIS — G5710 Meralgia paresthetica, unspecified lower limb: Secondary | ICD-10-CM

## 2014-03-28 DIAGNOSIS — R1031 Right lower quadrant pain: Secondary | ICD-10-CM

## 2014-03-28 DIAGNOSIS — G5711 Meralgia paresthetica, right lower limb: Secondary | ICD-10-CM

## 2014-03-28 DIAGNOSIS — F4381 Prolonged grief disorder: Secondary | ICD-10-CM

## 2014-03-28 MED ORDER — METHYLPREDNISOLONE ACETATE 80 MG/ML IJ SUSP
80.0000 mg | Freq: Once | INTRAMUSCULAR | Status: AC
  Administered 2014-03-28: 80 mg via INTRAMUSCULAR

## 2014-03-28 MED ORDER — LEVOCETIRIZINE DIHYDROCHLORIDE 5 MG PO TABS: 5.0000 mg | ORAL_TABLET | Freq: Every evening | ORAL | Status: DC

## 2014-03-28 NOTE — Patient Instructions (Addendum)
You had the steroid shot today  Please take all new medication as prescribed - the xyzal 5 mg  Please take all new medication as prescribed- the OTC B12 vitamin (one per day) OR the B complex multivitamin (one per day)  You will be contacted regarding the referral for: colonoscopy  Please return in 3 months, or sooner if needed

## 2014-03-28 NOTE — Progress Notes (Signed)
Subjective:    Patient ID: Sierra English, female    DOB: 06-Apr-1956, 58 y.o.   MRN: [id number]  HPI  Here to f/u, was [place], last seen here oct 2013, states approx 1 yr ago was [place] country by neurologist with dz of what sounds like right meralgia paresthetica, tx with b12 shots, but none for last 2 months as she rans out.  Has a RLQ pain as well intermittent o/w without GI symptoms  -Denies worsening reflux,  dysphagia, n/v, bowel change or blood. Also has a recurring luq costal margin tender pain after some recent wt gain.  Does have several wks ongoing nasal allergy symptoms with clearish congestion, itch and sneezing, without fever, pain, ST, cough, swelling or wheezing.  Also mentions prolonged sadness regarding her sons death 3 yrs ago, does not want tx at this time.  Denies worsening depressive symptoms, suicidal ideation, or panic; has ongoing anxiety.  Asks ofr referral colonsocpy, saw Dr Collene Mares in 2002 with last, but wants to be seen with  Past Medical History  Diagnosis Date  . ALLERGIC RHINITIS 08/29/2010  . ANXIETY 08/29/2010  . [place], [place] 08/29/2010  . CHEST PAIN 01/29/2011  . COLONIC POLYPS, HX OF 08/29/2010  . HYPERTENSION 08/29/2010  . Low back pain 05/27/2011  . MACULAR DEGENERATION 08/29/2010  . PARESTHESIA 01/29/2011  . THORACIC/LUMBOSACRAL NEURITIS/RADICULITIS UNSPEC 02/28/2011   Past Surgical History  Procedure Laterality Date  . Cholecystectomy      reports that she has never smoked. She does not have any smokeless tobacco history on file. She reports that she does not drink alcohol or use illicit drugs. family history includes Arthritis in her mother; Breast cancer (age of onset: 68) in her sister; Hypertension in her father; Lung cancer in her father. No Known Allergies Current Outpatient Prescriptions on File Prior to Visit  Medication Sig Dispense Refill  . albuterol (PROVENTIL HFA;VENTOLIN HFA) 108 (90 BASE) MCG/ACT inhaler Inhale  2 puffs into the lungs every 6 (six) hours as needed for wheezing.  1 Inhaler  11  . Black Cohosh 175 MG CAPS Take 1 capsule by mouth daily. menopause      . cetirizine (ZYRTEC) 10 MG tablet Take 1 tablet (10 mg total) by mouth daily as needed.  90 tablet  3  . clonazePAM (KLONOPIN) 0.5 MG tablet Take 0.5 mg by mouth 2 (two) times daily as needed for anxiety.      . Coenzyme Q10 (COQ-10) 200 MG CAPS Take 1 capsule by mouth every morning.      . Multiple Vitamins-Minerals (EYE VITAMINS PO) Take by mouth daily.      Marland Kitchen VITAMIN D, CHOLECALCIFEROL, PO Take by mouth daily.       No current facility-administered medications on file prior to visit.   Review of Systems  Constitutional: Negative for unexpected weight change, or unusual diaphoresis  HENT: Negative for tinnitus.   Eyes: Negative for photophobia and visual disturbance.  Respiratory: Negative for choking and stridor.   Gastrointestinal: Negative for vomiting and blood in stool.  Genitourinary: Negative for hematuria and decreased urine volume.  Musculoskeletal: Negative for acute joint swelling Skin: Negative for color change and wound.  Neurological: Negative for tremors and numbness other than noted  Psychiatric/Behavioral: Negative for decreased concentration or  hyperactivity.       Objective:   Physical Exam BP 130/82  Pulse 79  Temp(Src) 98.7 F (37.1 C) (Oral)  Ht 5\' 2"  (1.575 m)  Wt 134 lb 4 oz (60.895 kg)  BMI 24.55 kg/m2  SpO2 96% VS noted,  Constitutional: Pt appears well-developed and well-nourished.  HENT: Head: NCAT.  Right Ear: External ear normal.  Left Ear: External ear normal.  Bilat tm's with mild erythema.  Max sinus areas non tender.  Pharynx with mild erythema, no exudate Eyes: Conjunctivae and EOM are normal. Pupils are equal, round, and reactive to light.  Neck: Normal range of motion. Neck supple.  Cardiovascular: Normal rate and regular rhythm.   Pulmonary/Chest: Effort normal and breath sounds  normal.  Abd:  Sofjscript:void(0)t, NT, non-distended, + BS Neurological: Pt is alert. Not confused  Skin: Skin is warm. No erythema.  Psychiatric: Pt behavior is normal. Thought content normal. 1+ nervous, somewhat tearful at times    Assessment & Plan:

## 2014-03-28 NOTE — Assessment & Plan Note (Signed)
Asymp

## 2014-03-28 NOTE — Assessment & Plan Note (Addendum)
Exam benign, hx with intermittent mild discomfrot only, also described right lower back pain as well - ? Functional pain vs Referred radicalar, or even related to the meralgia paresthetica, would hold on furhter eval at this time pending any worsening,  Note:  Total time for pt hx, exam, review of record with pt in the room, determination of diagnoses and plan for further eval and tx is > 40 min, with over 50% spent in coordination and counseling of patient

## 2014-03-28 NOTE — Assessment & Plan Note (Signed)
Declines tx or referral for cousneling at this time

## 2014-03-28 NOTE — Assessment & Plan Note (Signed)
Per pt hx, to f/u with labs next visit

## 2014-03-28 NOTE — Assessment & Plan Note (Signed)
Uncontrolled, for depomedrol im, then xyzal 5 mg qd

## 2014-03-28 NOTE — Addendum Note (Signed)
Addended by: Sharon Seller B on: 03/28/2014 04:40 PM   Modules accepted: Orders

## 2014-03-28 NOTE — Assessment & Plan Note (Signed)
stable overall by history and exam, recent data reviewed with pt, and pt to continue medical treatment as before,  to f/u any worsening symptoms or concerns BP Readings from Last 3 Encounters:  03/28/14 130/82  01/02/14 130/80  10/19/12 130/78

## 2014-03-28 NOTE — Assessment & Plan Note (Signed)
Ok to take OTC b12 for now,  to f/u any worsening symptoms or concerns

## 2014-03-28 NOTE — Progress Notes (Signed)
Pre visit review using our clinic review tool, if applicable. No additional management support is needed unless otherwise documented below in the visit note. 

## 2014-04-13 ENCOUNTER — Ambulatory Visit: Payer: BC Managed Care – PPO | Admitting: Internal Medicine

## 2014-04-20 ENCOUNTER — Encounter: Payer: Self-pay | Admitting: Gastroenterology

## 2014-04-27 ENCOUNTER — Other Ambulatory Visit: Payer: Self-pay

## 2014-05-05 ENCOUNTER — Telehealth: Payer: Self-pay | Admitting: Internal Medicine

## 2014-05-05 NOTE — Telephone Encounter (Signed)
Patient would like a referral to neurologist for the problem she was [place] her country for mentioned at her (03/28/14) appointment.

## 2014-05-05 NOTE — Telephone Encounter (Signed)
Pt request phone call from Dr. Jenny Reichmann assistant only. Please call pt.

## 2014-05-05 NOTE — Telephone Encounter (Signed)
Unfortunately there is no need, as there is no need for further evaluation of meralgia paresthetica and there is no specific treatment, besides avoiding tight clothes to the hips and lower extremities

## 2014-05-09 NOTE — Telephone Encounter (Signed)
f °

## 2014-05-09 NOTE — Telephone Encounter (Signed)
Called - left message

## 2014-05-10 NOTE — Telephone Encounter (Signed)
Pt return call abck gave her md response. Pt stating that md told her couple years ago that she may need to see neurologist concerning her legs. Numbness stop and she didn't bother to f/u, but now the sxs has came bck. Inform pt that she need to be re-evaluated & if md thinks she need to see a neurologist he will do the referral then. Pt made appt to see md on this Friday 05/12/14...Johny Chess

## 2014-05-12 ENCOUNTER — Encounter: Payer: Self-pay | Admitting: Internal Medicine

## 2014-05-12 ENCOUNTER — Ambulatory Visit (INDEPENDENT_AMBULATORY_CARE_PROVIDER_SITE_OTHER): Payer: BC Managed Care – PPO | Admitting: Internal Medicine

## 2014-05-12 VITALS — BP 110/70 | HR 66 | Temp 97.9°F | Ht 62.0 in | Wt 130.8 lb

## 2014-05-12 DIAGNOSIS — R7302 Impaired glucose tolerance (oral): Secondary | ICD-10-CM

## 2014-05-12 DIAGNOSIS — R209 Unspecified disturbances of skin sensation: Secondary | ICD-10-CM

## 2014-05-12 DIAGNOSIS — R7309 Other abnormal glucose: Secondary | ICD-10-CM

## 2014-05-12 DIAGNOSIS — I10 Essential (primary) hypertension: Secondary | ICD-10-CM

## 2014-05-12 DIAGNOSIS — R202 Paresthesia of skin: Secondary | ICD-10-CM

## 2014-05-12 NOTE — Progress Notes (Signed)
Pre visit review using our clinic review tool, if applicable. No additional management support is needed unless otherwise documented below in the visit note. 

## 2014-05-12 NOTE — Progress Notes (Signed)
Subjective:    Patient ID: Sierra English, female    DOB: [birth year], 58 y.o.   MRN: [id number]  HPI  Here to f/u, unfortunately decreased sens to right ant thigh persists, and Now states bilat ant thigh/leg numbness worse with more pain, starting now on the left as well with a burning tingling. Pt denies chest pain, increased sob or doe, wheezing, orthopnea, PND, increased LE swelling, palpitations, dizziness or syncope. Pt denies new neurological symptoms such as new headache, or facial or extremity weakness or numbness other than above.  Denies signficant other worsening LBP. No hx of MS. Past Medical History  Diagnosis Date  . ALLERGIC RHINITIS 08/29/2010  . ANXIETY 08/29/2010  . [place], [place] 08/29/2010  . CHEST PAIN 01/29/2011  . COLONIC POLYPS, HX OF 08/29/2010  . HYPERTENSION 08/29/2010  . Low back pain 05/27/2011  . MACULAR DEGENERATION 08/29/2010  . PARESTHESIA 01/29/2011  . THORACIC/LUMBOSACRAL NEURITIS/RADICULITIS UNSPEC 02/28/2011   Past Surgical History  Procedure Laterality Date  . Cholecystectomy      reports that she has never smoked. She does not have any smokeless tobacco history on file. She reports that she does not drink alcohol or use illicit drugs. family history includes Arthritis in her mother; Breast cancer (age of onset: 87) in her sister; Hypertension in her father; Lung cancer in her father. No Known Allergies Current Outpatient Prescriptions on File Prior to Visit  Medication Sig Dispense Refill  . albuterol (PROVENTIL HFA;VENTOLIN HFA) 108 (90 BASE) MCG/ACT inhaler Inhale 2 puffs into the lungs every 6 (six) hours as needed for wheezing.  1 Inhaler  11  . Black Cohosh 175 MG CAPS Take 1 capsule by mouth daily. menopause      . cetirizine (ZYRTEC) 10 MG tablet Take 1 tablet (10 mg total) by mouth daily as needed.  90 tablet  3  . clonazePAM (KLONOPIN) 0.5 MG tablet Take 0.5 mg by mouth 2 (two) times daily as needed for anxiety.      . Coenzyme Q10 (COQ-10) 200  MG CAPS Take 1 capsule by mouth every morning.      Marland Kitchen levocetirizine (XYZAL) 5 MG tablet Take 1 tablet (5 mg total) by mouth every evening.  30 tablet  11  . Multiple Vitamins-Minerals (EYE VITAMINS PO) Take by mouth daily.      Marland Kitchen VITAMIN D, CHOLECALCIFEROL, PO Take by mouth daily.       No current facility-administered medications on file prior to visit.    Review of Systems  Constitutional: Negative for unusual diaphoresis or other sweats  HENT: Negative for ringing in ear Eyes: Negative for double vision or worsening visual disturbance.  Respiratory: Negative for choking and stridor.   Gastrointestinal: Negative for vomiting or other signifcant bowel change Genitourinary: Negative for hematuria or decreased urine volume.  Musculoskeletal: Negative for other MSK pain or swelling Skin: Negative for color change and worsening wound.  Neurological: Negative for tremors and numbness other than noted  Psychiatric/Behavioral: Negative for decreased concentration or agitation other than above       Objective:   Physical Exam BP 110/70  Pulse 66  Temp(Src) 97.9 F (36.6 C) (Oral)  Ht 5\' 2"  (1.575 m)  Wt 130 lb 12 oz (59.308 kg)  BMI 23.91 kg/m2  SpO2 97% VS noted,  Constitutional: Pt appears well-developed, well-nourished.  HENT: Head: NCAT.  Right Ear: External ear normal.  Left Ear: External ear normal.  Eyes: . Pupils are equal, round, and reactive to light. Conjunctivae and  EOM are normal Neck: Normal range of motion. Neck supple.  Cardiovascular: Normal rate and regular rhythm.   Pulmonary/Chest: Effort normal and breath sounds normal.  Abd:  Soft, NT, ND, + BS Neurological: Pt is alert. Not confused , motor grossly intact, gait intact Skin: Skin is warm. No rash Psychiatric: Pt behavior is normal. No agitation.  Decreased sens to right ant thigh in lat fem cutan nerve distribution, somewhat on left as well,     Assessment & Plan:

## 2014-05-12 NOTE — Patient Instructions (Signed)
Please continue all other medications as before, and refills have been done if requested. Please have the pharmacy call with any other refills you may need.  You will be contacted regarding the referral for: neurology  Please have your blood work done next week as planned  We will forward the results to Dr Mann/GI as well  We will then see you at your next appt for physical on June 3.2015

## 2014-05-13 NOTE — Assessment & Plan Note (Signed)
stable overall by history and exam, recent data reviewed with pt, and pt to continue medical treatment as before,  to f/u any worsening symptoms or concerns Lab Results  Component Value Date   HGBA1C 6.4 10/19/2012    

## 2014-05-13 NOTE — Assessment & Plan Note (Signed)
stable overall by history and exam, recent data reviewed with pt, and pt to continue medical treatment as before,  to f/u any worsening symptoms or concerns BP Readings from Last 3 Encounters:  05/12/14 110/70  03/28/14 130/82  01/02/14 130/80

## 2014-05-13 NOTE — Assessment & Plan Note (Signed)
Etiology unclear, to check b12, also refer neurology , ? Meralgia vs other,  to f/u any worsening symptoms or concerns

## 2014-05-15 ENCOUNTER — Other Ambulatory Visit (INDEPENDENT_AMBULATORY_CARE_PROVIDER_SITE_OTHER): Payer: BC Managed Care – PPO

## 2014-05-15 DIAGNOSIS — R209 Unspecified disturbances of skin sensation: Secondary | ICD-10-CM

## 2014-05-15 DIAGNOSIS — R202 Paresthesia of skin: Secondary | ICD-10-CM

## 2014-05-15 DIAGNOSIS — Z0000 Encounter for general adult medical examination without abnormal findings: Secondary | ICD-10-CM

## 2014-05-15 LAB — CBC WITH DIFFERENTIAL/PLATELET
BASOS ABS: 0 10*3/uL (ref 0.0–0.1)
BASOS PCT: 0.7 % (ref 0.0–3.0)
EOS ABS: 0.3 10*3/uL (ref 0.0–0.7)
Eosinophils Relative: 4 % (ref 0.0–5.0)
HCT: 42 % (ref 36.0–46.0)
HEMOGLOBIN: 14.2 g/dL (ref 12.0–15.0)
LYMPHS ABS: 3.1 10*3/uL (ref 0.7–4.0)
Lymphocytes Relative: 44.8 % (ref 12.0–46.0)
MCHC: 33.8 g/dL (ref 30.0–36.0)
MCV: 86.2 fl (ref 78.0–100.0)
MONO ABS: 0.5 10*3/uL (ref 0.1–1.0)
Monocytes Relative: 7.2 % (ref 3.0–12.0)
NEUTROS ABS: 3 10*3/uL (ref 1.4–7.7)
Neutrophils Relative %: 43.3 % (ref 43.0–77.0)
Platelets: 249 10*3/uL (ref 150.0–400.0)
RBC: 4.87 Mil/uL (ref 3.87–5.11)
RDW: 13.3 % (ref 11.5–15.5)
WBC: 6.9 10*3/uL (ref 4.0–10.5)

## 2014-05-15 LAB — LIPID PANEL
Cholesterol: 209 mg/dL — ABNORMAL HIGH (ref 0–200)
HDL: 55.5 mg/dL (ref >39.00)
LDL CALC: 135 mg/dL — AB (ref 0–99)
TRIGLYCERIDES: 93 mg/dL (ref 0.0–149.0)
Total CHOL/HDL Ratio: 4
VLDL: 18.6 mg/dL (ref 0.0–40.0)

## 2014-05-15 LAB — BASIC METABOLIC PANEL
BUN: 12 mg/dL (ref 6–23)
CHLORIDE: 102 meq/L (ref 96–112)
CO2: 30 mEq/L (ref 19–32)
CREATININE: 0.6 mg/dL (ref 0.4–1.2)
Calcium: 9.7 mg/dL (ref 8.4–10.5)
GFR: 104.87 mL/min (ref >60.00)
Glucose, Bld: 108 mg/dL — ABNORMAL HIGH (ref 70–99)
Potassium: 4.1 mEq/L (ref 3.5–5.1)
SODIUM: 138 meq/L (ref 135–145)

## 2014-05-15 LAB — HEPATIC FUNCTION PANEL
ALK PHOS: 100 U/L (ref 39–117)
ALT: 30 U/L (ref 0–35)
AST: 22 U/L (ref 0–37)
Albumin: 4.1 g/dL (ref 3.5–5.2)
BILIRUBIN DIRECT: 0.2 mg/dL (ref 0.0–0.3)
BILIRUBIN TOTAL: 1.6 mg/dL — AB (ref 0.2–1.2)
Total Protein: 7 g/dL (ref 6.0–8.3)

## 2014-05-15 LAB — URINALYSIS, ROUTINE W REFLEX MICROSCOPIC
BILIRUBIN URINE: NEGATIVE
HGB URINE DIPSTICK: NEGATIVE
KETONES UR: NEGATIVE
Leukocytes, UA: NEGATIVE
Nitrite: NEGATIVE
Specific Gravity, Urine: <=1.005 — AB (ref 1.000–1.030)
TOTAL PROTEIN, URINE-UPE24: NEGATIVE
Urine Glucose: NEGATIVE
Urobilinogen, UA: 0.2 (ref 0.0–1.0)
WBC UA: NONE SEEN — AB (ref 0–?)
pH: 6 (ref 5.0–8.0)

## 2014-05-15 LAB — TSH: TSH: 4.12 u[IU]/mL (ref 0.35–4.50)

## 2014-05-15 LAB — VITAMIN B12: Vitamin B-12: 670 pg/mL (ref 211–911)

## 2014-05-31 ENCOUNTER — Ambulatory Visit (INDEPENDENT_AMBULATORY_CARE_PROVIDER_SITE_OTHER)
Admission: RE | Admit: 2014-05-31 | Discharge: 2014-05-31 | Disposition: A | Discharge status: Home / Self Care | Payer: BC Managed Care – PPO | Source: Ambulatory Visit | Attending: Internal Medicine | Admitting: Internal Medicine

## 2014-05-31 ENCOUNTER — Encounter: Payer: Self-pay | Admitting: Internal Medicine

## 2014-05-31 ENCOUNTER — Telehealth: Payer: Self-pay | Admitting: Internal Medicine

## 2014-05-31 ENCOUNTER — Ambulatory Visit (INDEPENDENT_AMBULATORY_CARE_PROVIDER_SITE_OTHER): Payer: BC Managed Care – PPO | Admitting: Internal Medicine

## 2014-05-31 VITALS — BP 112/70 | HR 78 | Temp 98.6°F | Ht 62.0 in | Wt 133.2 lb

## 2014-05-31 DIAGNOSIS — M8580 Other specified disorders of bone density and structure, unspecified site: Secondary | ICD-10-CM

## 2014-05-31 DIAGNOSIS — Z0000 Encounter for general adult medical examination without abnormal findings: Secondary | ICD-10-CM

## 2014-05-31 DIAGNOSIS — R7302 Impaired glucose tolerance (oral): Secondary | ICD-10-CM

## 2014-05-31 DIAGNOSIS — I10 Essential (primary) hypertension: Secondary | ICD-10-CM

## 2014-05-31 DIAGNOSIS — F411 Generalized anxiety disorder: Secondary | ICD-10-CM

## 2014-05-31 DIAGNOSIS — M949 Disorder of cartilage, unspecified: Secondary | ICD-10-CM

## 2014-05-31 DIAGNOSIS — R7309 Other abnormal glucose: Secondary | ICD-10-CM

## 2014-05-31 DIAGNOSIS — M899 Disorder of bone, unspecified: Secondary | ICD-10-CM

## 2014-05-31 DIAGNOSIS — E785 Hyperlipidemia, unspecified: Secondary | ICD-10-CM

## 2014-05-31 MED ORDER — SERTRALINE HCL 50 MG PO TABS: 50.0000 mg | ORAL_TABLET | Freq: Every day | ORAL | Status: DC

## 2014-05-31 NOTE — Patient Instructions (Signed)
Please take all new medication as prescribed - the zoloft (generic) at 50 mg per day  Please call in 3-4 wks if you feel you would need the zoloft increased to 100 mg per day  Please continue all other medications as before, and refills have been done if requested.  Please have the pharmacy call with any other refills you may need.  Please continue your efforts at being more active, low cholesterol diet, and weight control.  You are otherwise up to date with prevention measures today.  Please keep your appointments with your specialists as you may have planned  Please schedule the bone density test before leaving today at the scheduling desk (where you check out)  Please return in 6 months, or sooner if needed

## 2014-05-31 NOTE — Assessment & Plan Note (Signed)

## 2014-05-31 NOTE — Assessment & Plan Note (Signed)
Also for dxa

## 2014-05-31 NOTE — Telephone Encounter (Signed)
Relevant patient education mailed to patient.  

## 2014-05-31 NOTE — Progress Notes (Signed)
Pre visit review using our clinic review tool, if applicable. No additional management support is needed unless otherwise documented below in the visit note. 

## 2014-05-31 NOTE — Assessment & Plan Note (Signed)
To add zolfot 50 qd as she wants to start lower dose, but I think should likely be 100 mg for best effect

## 2014-05-31 NOTE — Assessment & Plan Note (Signed)
stable overall by history and exam, recent data reviewed with pt, and pt to continue medical treatment as before,  to f/u any worsening symptoms or concerns Lab Results  Component Value Date   HGBA1C 6.4 10/19/2012

## 2014-05-31 NOTE — Assessment & Plan Note (Signed)
stable overall by history and exam, recent data reviewed with pt, and pt to continue medical treatment as before,  to f/u any worsening symptoms or concerns Lab Results  Component Value Date   LDLCALC 135* 05/15/2014   For lower chol diet, declines statin for now

## 2014-05-31 NOTE — Assessment & Plan Note (Signed)
stable overall by history and exam, recent data reviewed with pt, and pt to continue medical treatment as before,  to f/u any worsening symptoms or concerns BP Readings from Last 3 Encounters:  05/31/14 112/70  05/12/14 110/70  03/28/14 130/82

## 2014-05-31 NOTE — Progress Notes (Signed)
Subjective:    Patient ID: Sierra English, female    DOB: [birth year], 58 y.o.   MRN: [id number]  HPI  Here for wellness and f/u;  Overall doing ok;  Pt denies CP, worsening SOB, DOE, wheezing, orthopnea, PND, worsening LE edema, palpitations, dizziness or syncope.  Pt denies neurological change such as new headache, facial or extremity weakness.  Pt denies polydipsia, polyuria, or low sugar symptoms. Pt states overall good compliance with treatment and medications, good tolerability, and has been trying to follow lower cholesterol diet.  Pt has had mild worsening depressive symptoms, but no suicidal ideation or panic but has ongoing anxiety not well controled with the klonopin. Now ready to start daily med.. No fever, night sweats, wt loss, loss of appetite, or other constitutional symptoms.  Pt states good ability with ADL's, has low fall risk, home safety reviewed and adequate, no other significant changes in hearing or vision, and only occasionally active with exercise.  Asks about shingles shot, due at 58yo.   Past Medical History  Diagnosis Date  . ALLERGIC RHINITIS 08/29/2010  . ANXIETY 08/29/2010  . [place], [place] 08/29/2010  . CHEST PAIN 01/29/2011  . COLONIC POLYPS, HX OF 08/29/2010  . HYPERTENSION 08/29/2010  . Low back pain 05/27/2011  . MACULAR DEGENERATION 08/29/2010  . PARESTHESIA 01/29/2011  . THORACIC/LUMBOSACRAL NEURITIS/RADICULITIS UNSPEC 02/28/2011   Past Surgical History  Procedure Laterality Date  . Cholecystectomy      reports that she has never smoked. She does not have any smokeless tobacco history on file. She reports that she does not drink alcohol or use illicit drugs. family history includes Arthritis in her mother; Breast cancer (age of onset: 4) in her sister; Hypertension in her father; Lung cancer in her father. No Known Allergies Current Outpatient Prescriptions on File Prior to Visit  Medication Sig Dispense Refill  . albuterol (PROVENTIL HFA;VENTOLIN HFA) 108  (90 BASE) MCG/ACT inhaler Inhale 2 puffs into the lungs every 6 (six) hours as needed for wheezing.  1 Inhaler  11  . Black Cohosh 175 MG CAPS Take 1 capsule by mouth daily. menopause      . cetirizine (ZYRTEC) 10 MG tablet Take 1 tablet (10 mg total) by mouth daily as needed.  90 tablet  3  . clonazePAM (KLONOPIN) 0.5 MG tablet Take 0.5 mg by mouth 2 (two) times daily as needed for anxiety.      . Coenzyme Q10 (COQ-10) 200 MG CAPS Take 1 capsule by mouth every morning.      Marland Kitchen levocetirizine (XYZAL) 5 MG tablet Take 1 tablet (5 mg total) by mouth every evening.  30 tablet  11  . Multiple Vitamins-Minerals (EYE VITAMINS PO) Take by mouth daily.      Marland Kitchen VITAMIN D, CHOLECALCIFEROL, PO Take by mouth daily.       No current facility-administered medications on file prior to visit.   Review of Systems Constitutional: Negative for increased diaphoresis, other activity, appetite or other siginficant weight change  HENT: Negative for worsening hearing loss, ear pain, facial swelling, mouth sores and neck stiffness.   Eyes: Negative for other worsening pain, redness or visual disturbance.  Respiratory: Negative for shortness of breath and wheezing.   Cardiovascular: Negative for chest pain and palpitations.  Gastrointestinal: Negative for diarrhea, blood in stool, abdominal distention or other pain Genitourinary: Negative for hematuria, flank pain or change in urine volume.  Musculoskeletal: Negative for myalgias or other joint complaints.  Skin: Negative for color change and wound.  Neurological: Negative for syncope and numbness. other than noted Hematological: Negative for adenopathy. or other swelling Psychiatric/Behavioral: Negative for hallucinations, self-injury, decreased concentration or other worsening agitation.      Objective:   Physical Exam BP 112/70  Pulse 78  Temp(Src) 98.6 F (37 C) (Oral)  Ht 5\' 2"  (1.575 m)  Wt 133 lb 4 oz (60.442 kg)  BMI 24.37 kg/m2  SpO2 97% VS noted,    Constitutional: Pt is oriented to person, place, and time. Appears well-developed and well-nourished.  Head: Normocephalic and atraumatic.  Right Ear: External ear normal.  Left Ear: External ear normal.  Nose: Nose normal.  Mouth/Throat: Oropharynx is clear and moist.  Eyes: Conjunctivae and EOM are normal. Pupils are equal, round, and reactive to light.  Neck: Normal range of motion. Neck supple. No JVD present. No tracheal deviation present.  Cardiovascular: Normal rate, regular rhythm, normal heart sounds and intact distal pulses.   Pulmonary/Chest: Effort normal and breath sounds without rales or wheezing  Abdominal: Soft. Bowel sounds are normal. NT. No HSM  Musculoskeletal: Normal range of motion. Exhibits no edema.  Lymphadenopathy:  Has no cervical adenopathy.  Neurological: Pt is alert and oriented to person, place, and time. Pt has normal reflexes. No cranial nerve deficit. Motor grossly intact, still with decr sens to right ant thigh to LT c/w meralgia Skin: Skin is warm and dry. No rash noted.  Psychiatric:  Has 2+ nervous mood and affect. Behavior is normal.     Assessment & Plan:

## 2014-06-02 ENCOUNTER — Encounter: Payer: Self-pay | Admitting: Internal Medicine

## 2014-06-05 ENCOUNTER — Encounter: Payer: BC Managed Care – PPO | Admitting: Gastroenterology

## 2014-06-15 ENCOUNTER — Encounter: Payer: Self-pay | Admitting: Neurology

## 2014-06-15 ENCOUNTER — Ambulatory Visit (INDEPENDENT_AMBULATORY_CARE_PROVIDER_SITE_OTHER): Payer: BC Managed Care – PPO | Admitting: Neurology

## 2014-06-15 VITALS — BP 158/80 | HR 84 | Ht 61.02 in | Wt 131.2 lb

## 2014-06-15 DIAGNOSIS — G5711 Meralgia paresthetica, right lower limb: Secondary | ICD-10-CM

## 2014-06-15 DIAGNOSIS — G5710 Meralgia paresthetica, unspecified lower limb: Secondary | ICD-10-CM

## 2014-06-15 NOTE — Patient Instructions (Signed)
1. Avoid tight fitting clothing or belts  2. Weight loss encouraged 3. When burning pain becomes worse, can try topical vicks vaborub to area 4. Return to clinic as needed

## 2014-06-15 NOTE — Progress Notes (Signed)
[facility] Clinic Note - Initial Visit   Date: 06/15/2014  Sierra English MRN: [id number] DOB: 1956/11/21   Dear Dr Jenny Reichmann:  Thank you for your kind referral of Sierra English for consultation of numbness involving the right thigh. Although her history is well known to you, please allow [place] to reiterate it for the purpose of our medical record. The patient was accompanied to the clinic by self.    History of Present Illness: Sierra English is a 58 y.o. right-handed [place] female with history of anxiety/depression presenting for evaluation of numbness of the right thigh.    Starting around 2011, she developed numbness of the right thigh which has not improved.  Recently, she has noticed intermittent and rare episodes of burning sensation over a small portion of the anterior thigh.  Symptoms are improved by massage and worsened by prolonged sitting and standing.  It is better when she lays down.   She denies any back pain or bowel/bladder incontinence. She had EMG in [place] which showed meralgia paresthetica on the right side and mild L4-S1 radiculopathy.  Previously seen by Dr. Jannifer Franklin on January 06, 2012 for same complaints and diagnosed with right meralgia paresthetica.  She has not gained any recent weight and previously was wearing tight fitting pants.  She has since started to wear loose fitting clothing, but has not noticed any improvement.  She also complains of intermittent numbness/tingling of the left 4th and 5th digits, occuring a few times per month.  She has not noticed any triggers and denies any weakness.  Of note, her son who was is in residency was tragically killed by a fellow resident on 04/09/2011 after which she reports having great deal of stress in her life.    Out-side paper records, electronic medical record, and images have been reviewed where available and summarized as:  Labs 05/15/2014:  vitamin B12 670, TSH 4.12  CT lumbar  spine 09/17/2011: No disc pathology. Minimal facet degeneration at L4-5. More pronounced facet degeneration at L5-S1 right worse than left. This could certainly be symptomatic. Could the patient's pain relate to facet syndrome?  MRI cervical spine without contrast 06/12/2014: Minimal degenerative retrolisthesis very slight bulking ligamentum flavum at C3-C4, C4-C5, and C5-C6 resulting in borderline canal narrowing. No significant central stenosis. No cord contact or compression. Neuroforamen are patent throughout. No intradural pathology.  MRI brain without contrast 06/13/19/15: Unremarkable MRI of the head.  NCS/EMG performed in [place]:  Meralgia paresthetica on the right side in the backgroun of chronic mild L4 and mild-to-moderate L5-S1 root irritation.  Past Medical History  Diagnosis Date  . ALLERGIC RHINITIS 08/29/2010  . ANXIETY 08/29/2010  . [place], [place] 08/29/2010  . CHEST PAIN 01/29/2011  . COLONIC POLYPS, HX OF 08/29/2010  . HYPERTENSION 08/29/2010  . Low back pain 05/27/2011  . MACULAR DEGENERATION 08/29/2010  . PARESTHESIA 01/29/2011  . THORACIC/LUMBOSACRAL NEURITIS/RADICULITIS UNSPEC 02/28/2011    Past Surgical History  Procedure Laterality Date  . Cholecystectomy       Medications:  Current Outpatient Prescriptions on File Prior to Visit  Medication Sig Dispense Refill  . albuterol (PROVENTIL HFA;VENTOLIN HFA) 108 (90 BASE) MCG/ACT inhaler Inhale 2 puffs into the lungs every 6 (six) hours as needed for wheezing.  1 Inhaler  11  . Black Cohosh 175 MG CAPS Take 1 capsule by mouth daily. menopause      . cetirizine (ZYRTEC) 10 MG tablet Take 1 tablet (10 mg total) by mouth daily as needed.  [place]  tablet  3  . clonazePAM (KLONOPIN) 0.5 MG tablet Take 0.5 mg by mouth 2 (two) times daily as needed for anxiety.      . Coenzyme Q10 (COQ-10) 200 MG CAPS Take 1 capsule by mouth every morning.      Marland Kitchen levocetirizine (XYZAL) 5 MG tablet Take 1 tablet (5 mg total) by mouth every evening.  30 tablet   11  . Multiple Vitamins-Minerals (EYE VITAMINS PO) Take by mouth daily.      . sertraline (ZOLOFT) 50 MG tablet Take 1 tablet (50 mg total) by mouth daily.  90 tablet  3  . VITAMIN D, CHOLECALCIFEROL, PO Take by mouth daily.       No current facility-administered medications on file prior to visit.    Allergies: No Known Allergies  Family History: Family History  Problem Relation Age of Onset  . Arthritis Mother   . Hypertension Father   . Lung cancer Father   . Breast cancer Sister 57    Social History: History   Social History  . Marital Status: Married    Spouse Name: N/A    Number of Children: 2  . Years of Education: N/A   Occupational History  . Not on file.   Social History Main Topics  . Smoking status: Never Smoker   . Smokeless tobacco: Not on file  . Alcohol Use: No  . Drug Use: No  . Sexual Activity:    Other Topics Concern  . Not on file   Social History Narrative   Lives with husband and daughter.  From [place] originally.    Review of Systems:  CONSTITUTIONAL: No fevers, chills, night sweats, or weight loss.   EYES: No visual changes or eye pain ENT: No hearing changes.  No history of nose bleeds.   RESPIRATORY: No cough, wheezing and shortness of breath.   CARDIOVASCULAR: Negative for chest pain, and palpitations.   GI: Negative for abdominal discomfort, blood in stools or black stools.  No recent change in bowel habits.   GU:  No history of incontinence.   MUSCLOSKELETAL: No history of joint pain or swelling.  No myalgias.   SKIN: Negative for lesions, rash, and itching.   HEMATOLOGY/ONCOLOGY: Negative for prolonged bleeding, bruising easily, and swollen nodes.  No history of cancer.   ENDOCRINE: Negative for cold or heat intolerance, polydipsia or goiter.   PSYCH:  +depression or anxiety symptoms.   NEURO: As Above.   Vital Signs:  BP 158/80  Pulse 84  Ht 5' 1.02" (1.55 m)  Wt 131 lb 3 oz (59.506 kg)  BMI 24.77 kg/m2  SpO2 97% Pain  Scale: 0 on a scale of 0-10   General Medical Exam:   General:  Well appearing, comfortable.   Eyes/ENT: see cranial nerve examination.   Neck: No masses appreciated.  Full range of motion without tenderness.  No carotid bruits. Respiratory:  Clear to auscultation, good air entry bilaterally.   Cardiac:  Regular rate and rhythm, no murmur.   Extremities:  No deformities, edema, or skin discoloration. Good capillary refill.   Skin:  Skin color, texture, turgor normal. No rashes or lesions.  Neurological Exam: MENTAL STATUS including orientation to time, place, person, recent and remote memory, attention span and concentration, language, and fund of knowledge is normal.  Speech is not dysarthric.  CRANIAL NERVES: II:  No visual field defects.  Unremarkable fundi.   III-IV-VI: Pupils equal round and reactive to light.  Normal conjugate, extra-ocular eye movements  in all directions of gaze.  No nystagmus.  No ptosis.   V:  Normal facial sensation.   VII:  Normal facial symmetry and movements.   VIII:  Normal hearing and vestibular function.   IX-X:  Normal palatal movement.   XI:  Normal shoulder shrug and head rotation.   XII:  Normal tongue strength and range of motion, no deviation or fasciculation.  MOTOR:  No atrophy, fasciculations or abnormal movements.  No pronator drift.  Tone is normal.    Right Upper Extremity:    Left Upper Extremity:    Deltoid  5/5   Deltoid  5/5   Biceps  5/5   Biceps  5/5   Triceps  5/5   Triceps  5/5   Wrist extensors  5/5   Wrist extensors  5/5   Wrist flexors  5/5   Wrist flexors  5/5   Finger extensors  5/5   Finger extensors  5/5   Finger flexors  5/5   Finger flexors  5/5   Dorsal interossei  5/5   Dorsal interossei  5/5   Abductor pollicis  5/5   Abductor pollicis  5/5   Tone (Ashworth scale)  0  Tone (Ashworth scale)  0   Right Lower Extremity:    Left Lower Extremity:    Hip flexors  5/5   Hip flexors  5/5   Hip extensors  5/5   Hip  extensors  5/5   Knee flexors  5/5   Knee flexors  5/5   Knee extensors  5/5   Knee extensors  5/5   Dorsiflexors  5/5   Dorsiflexors  5/5   Plantarflexors  5/5   Plantarflexors  5/5   Toe extensors  5/5   Toe extensors  5/5   Toe flexors  5/5   Toe flexors  5/5   Tone (Ashworth scale)  0  Tone (Ashworth scale)  0   MSRs:  Right                                                                 Left brachioradialis 2+  brachioradialis 2+  biceps 2+  biceps 2+  triceps 2+  triceps 2+  patellar 2+  patellar 2+  ankle jerk 2+  ankle jerk 2+  Hoffman no  Hoffman no  plantar response down  plantar response down   SENSORY:  Pin prick and temperature reduced over the right anterolateral thigh, otherwise, normal and symmetric perception of light touch, pinprick, vibration, and proprioception.  Romberg's sign absent.   COORDINATION/GAIT: Normal finger-to- nose-finger and heel-to-shin.  Intact rapid alternating movements bilaterally.  Able to rise from a chair without using arms.  Gait narrow based and stable. Tandem and stressed gait intact.    IMPRESSION: Mrs. Crate is a 58 year-old female presenting for evaluation of right thigh numbness and left hand paresthesias.  Her examination is consistent with diminished sensation over the anterolateral thigh on the right side. 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 Management is conservative with NSAIDs and avoidance of repetitive trauma to this region.  I have discussed the diagnosis, pathophysiology, management plan, and prognosis.  I have further reasssured her that she does not have any worrisome neurodegenerative condition such as multiple sclerosis, which is her fear.  Regarding her left hand paresthesias, she may have early signs of ulnar neuropathy and discussed that if her symptoms become more frequent or worsen, EMG can be performed. At  this time, symptoms are too sporadic and mild to warrant testing.    PLAN/RECOMMENDATIONS:  1. Avoid tight fitting clothing or belts  2. Weight loss encouraged 3. When burning pain becomes worse, can try topical ointments such as vicks vaborub to area. The burning paresthesias are too seldom to start daily medication which she agrees with. 4. If left hand paresthesias worsen, she will contact the office to schedule EMG  5. Return to clinic as needed    The duration of this appointment visit was 40 minutes of face-to-face time with the patient.  Greater than 50% of this time was spent in counseling, explanation of diagnosis, planning of further management, and coordination of care.   Thank you for allowing me to participate in patient's care.  If I can answer any additional questions, I would be pleased to do so.    Sincerely,    Donika K. Posey Pronto, DO

## 2014-06-20 ENCOUNTER — Encounter: Payer: Self-pay | Admitting: Internal Medicine

## 2014-06-20 ENCOUNTER — Ambulatory Visit (INDEPENDENT_AMBULATORY_CARE_PROVIDER_SITE_OTHER): Payer: BC Managed Care – PPO | Admitting: Internal Medicine

## 2014-06-20 ENCOUNTER — Other Ambulatory Visit: Payer: BC Managed Care – PPO

## 2014-06-20 VITALS — BP 120/76 | HR 72 | Ht 62.0 in | Wt 134.4 lb

## 2014-06-20 DIAGNOSIS — Z91018 Allergy to other foods: Secondary | ICD-10-CM

## 2014-06-20 DIAGNOSIS — J302 Other seasonal allergic rhinitis: Secondary | ICD-10-CM

## 2014-06-20 DIAGNOSIS — H1045 Other chronic allergic conjunctivitis: Secondary | ICD-10-CM

## 2014-06-20 DIAGNOSIS — J45909 Unspecified asthma, uncomplicated: Secondary | ICD-10-CM

## 2014-06-20 DIAGNOSIS — H1010 Acute atopic conjunctivitis, unspecified eye: Secondary | ICD-10-CM

## 2014-06-20 DIAGNOSIS — J3089 Other allergic rhinitis: Secondary | ICD-10-CM

## 2014-06-20 DIAGNOSIS — H1013 Acute atopic conjunctivitis, bilateral: Secondary | ICD-10-CM

## 2014-06-20 DIAGNOSIS — J4520 Mild intermittent asthma, uncomplicated: Secondary | ICD-10-CM

## 2014-06-20 DIAGNOSIS — J309 Allergic rhinitis, unspecified: Secondary | ICD-10-CM

## 2014-06-20 DIAGNOSIS — R21 Rash and other nonspecific skin eruption: Secondary | ICD-10-CM

## 2014-06-20 MED ORDER — XYZAL 5 MG PO TABS: ORAL_TABLET | ORAL | Status: DC

## 2014-06-20 MED ORDER — ALBUTEROL SULFATE HFA 108 (90 BASE) MCG/ACT IN AERS: 2.0000 | INHALATION_SPRAY | Freq: Four times a day (QID) | RESPIRATORY_TRACT | Status: DC | PRN

## 2014-06-20 NOTE — Assessment & Plan Note (Signed)
Plan- ok to stay with Xyzal- she feels it works best, Allergy profile

## 2014-06-20 NOTE — Assessment & Plan Note (Signed)
Plan-refill rescue inhaler 

## 2014-06-20 NOTE — Assessment & Plan Note (Addendum)
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

## 2014-06-20 NOTE — Assessment & Plan Note (Signed)
Pln- otc allergy eye drops for trial

## 2014-06-20 NOTE — Progress Notes (Signed)
06/20/14- 36 yoF never smoker Self Referral-having trouble with cat and house dust(noticed when cleaning)-causes breathing issues. Usually has seasonal allergies (Spring and Fall). States allergies dx'd 40 yrs ago. Allergy skin testing followed by 2 years of allergy shots 10 yrs ago. Allergy vaccine seemed helpful first year, but then symptoms seemed no better next yea.  Spring and Fall sneeze, itching eyes, rhinorhea all worse in the last month this year. Some short of breath and wheeze helped by SABA rescyue inhaler used about twice/ year.  Triggers- Seasonal pollens, perennial exposure to dust, cats. Food- kiwi fruit- can make throat itch. No rash.  No hx aspirin intolerance, nasal polyps.  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 Blames this for some symptoms. Meds- prefers Xyzal over otc antihistamines  Prior to Admission medications   Medication Sig Start Date End Date Taking? Authorizing Provider  Black Cohosh 175 MG CAPS Take 1 capsule by mouth daily. menopause   Yes Historical Provider, MD  clonazePAM (KLONOPIN) 0.5 MG tablet Take 0.5 mg by mouth 2 (two) times daily as needed for anxiety.   Yes Historical Provider, MD  Coenzyme Q10 (COQ-10) 200 MG CAPS Take 1 capsule by mouth every morning.   Yes Historical Provider, MD  ESTRACE VAGINAL 0.1 MG/GM vaginal cream  05/08/14  Yes Historical Provider, MD  GAVILYTE-G 236 G solution  05/12/14  Yes Historical Provider, MD  Multiple Vitamins-Minerals (EYE VITAMINS PO) Take by mouth daily.   Yes Historical Provider, MD  sertraline (ZOLOFT) 50 MG tablet Take 1 tablet (50 mg total) by mouth daily. 05/31/14  Yes Biagio Borg, MD  SOOLANTRA 1 % CREA  04/27/14  Yes Historical Provider, MD  VITAMIN D, CHOLECALCIFEROL, PO Take by mouth daily.   Yes Historical Provider, MD  XYZAL 5 MG tablet 1 daily at bedtime for allergy as directed 06/20/14  Yes Deneise Lever, MD  albuterol  (PROVENTIL HFA;VENTOLIN HFA) 108 (90 BASE) MCG/ACT inhaler Inhale 2 puffs into the lungs every 6 (six) hours as needed for wheezing or shortness of breath. 06/20/14   Deneise Lever, MD     Past Medical History  Diagnosis Date  . ALLERGIC RHINITIS 08/29/2010  . ANXIETY 08/29/2010  . [place] 08/29/2010  . CHEST PAIN 01/29/2011  . COLONIC POLYPS, HX OF 08/29/2010  . HYPERTENSION 08/29/2010  . Low back pain 05/27/2011  . MACULAR DEGENERATION 08/29/2010  . PARESTHESIA 01/29/2011  . THORACIC/LUMBOSACRAL NEURITIS/RADICULITIS UNSPEC 02/28/2011   Past Surgical History  Procedure Laterality Date  . Cholecystectomy     Family History  Problem Relation Age of Onset  . Arthritis Mother   . Hypertension Father   . Lung cancer Father   . Breast cancer Sister 71  . Heart disease Sister    History   Social History  . Marital Status: Married    Spouse Name: N/A    Number of Children: 2  . Years of Education: N/A   Occupational History  . hairdresser    Social History Main Topics  . Smoking status: Never Smoker   . Smokeless tobacco: Not on file  . Alcohol Use: No  . Drug Use: No  . Sexual Activity:    Other Topics Concern  . Not on file   Social History Narrative   Lives with husband and daughter.  From [place] originally.   ROS-see HPI Constitutional:   No-   weight loss, night sweats, fevers, chills, fatigue, lassitude.  HEENT:   No-  headaches, difficulty swallowing, tooth/dental problems, sore throat,       + sneezing, +itching, ear ache, +nasal congestion, +post nasal drip,  CV:  No-   chest pain, orthopnea, PND, swelling in lower extremities, anasarca,                                  dizziness, palpitations Resp: No-   shortness of breath with exertion or at rest.              No-   productive cough,  No non-productive cough,  No- coughing up of blood.              No-   change in color of mucus.  No- wheezing.   Skin: No-   rash or lesions. GI:  +heartburn, indigestion,  abdominal pain, nausea, vomiting, diarrhea,                 change in bowel habits, loss of appetite GU: No-   dysuria, change in color of urine, no urgency or frequency.  No- flank pain. MS:  + joint pain or swelling.  No- decreased range of motion.  No- back pain. Neuro-     nothing unusual Psych:  No- change in mood or affect. No depression or anxiety.  No memory loss.  OBJ- Physical Exam General- Alert, Oriented, Affect-appropriate, Distress- none acute Skin- rash-none, lesions- none, excoriation- none Lymphadenopathy- none Head- atraumatic            Eyes- Gross vision intact, PERRLA, conjunctivae and secretions clear            Ears- Hearing, canals-normal            Nose- Clear, no-Septal dev, mucus, polyps, erosion, perforation             Throat- Mallampati III-IV , mucosa clear , drainage- none, tonsils- atrophic Neck- flexible , trachea midline, no stridor , thyroid nl, carotid no bruit Chest - symmetrical excursion , unlabored           Heart/CV- RRR , no murmur , no gallop  , no rub, nl s1 s2                           - JVD- none , edema- none, stasis changes- none, varices- none           Lung- clear to P&A, wheeze- none, cough- none , dullness-none, rub- none           Chest wall-  Abd- tender-no, distended-no, bowel sounds-present, HSM- no Br/ Gen/ Rectal- Not done, not indicated Extrem- cyanosis- none, clubbing, none, atrophy- none, strength- nl Neuro- grossly intact to observation

## 2014-06-20 NOTE — Patient Instructions (Signed)
Order- lab- Allergy profile , Food IgE profile      Dx allergic rhinitis and conjunctivitis, food allergy  Refill prescriptions for albuterol HFA rescue inhaler and for levocetirizine antihistamine tabs were sent to [facility] can try otc allergy eye drops suc ha as [place]away or Naphcon, as needed for itching eyes.

## 2014-06-20 NOTE — Assessment & Plan Note (Signed)
Denied at this visit

## 2014-06-21 LAB — ALLERGEN FOOD PROFILE SPECIFIC IGE
APPLE: 24.1 kU/L — AB
CHICKEN IGE: 3.46 kU/L — AB
Corn: 23 kU/L — ABNORMAL HIGH
EGG WHITE IGE: 0.2 kU/L — AB
FISH COD: 0.63 kU/L — AB
IgE (Immunoglobulin E), Serum: 1829.1 IU/mL — ABNORMAL HIGH (ref 0.0–180.0)
MILK IGE: 0.24 kU/L — AB
Orange: 13.4 kU/L — ABNORMAL HIGH
Peanut IgE: 24 kU/L — ABNORMAL HIGH
SOYBEAN IGE: 20.7 kU/L — AB
Shrimp IgE: 2.52 kU/L — ABNORMAL HIGH
TUNA IGE: 1.5 kU/L — AB
Tomato IgE: 29.7 kU/L — ABNORMAL HIGH
Wheat IgE: 22 kU/L — ABNORMAL HIGH

## 2014-06-21 LAB — ALLERGY FULL PROFILE
ALTERNARIA ALTERNATA: 0.58 kU/L — AB
Allergen, D pternoyssinus,d7: >100 kU/L — ABNORMAL HIGH
Allergen,Goose feathers, e70: 0.41 kU/L — ABNORMAL HIGH
Aspergillus fumigatus, m3: 0.56 kU/L — ABNORMAL HIGH
BERMUDA GRASS: 40.1 kU/L — AB
Bahia Grass: 50.9 kU/L — ABNORMAL HIGH
Box Elder IgE: 58.9 kU/L — ABNORMAL HIGH
CANDIDA ALBICANS: 0.27 kU/L — AB
CAT DANDER: 18.2 kU/L — AB
CURVULARIA LUNATA: 0.24 kU/L — AB
Common Ragweed: 58.3 kU/L — ABNORMAL HIGH
D. farinae: >100 kU/L — ABNORMAL HIGH
Dog Dander: 26.7 kU/L — ABNORMAL HIGH
ELM IGE: 52.4 kU/L — AB
FESCUE: 47.6 kU/L — AB
G005 Rye, Perennial: 52.9 kU/L — ABNORMAL HIGH
G009 Red Top: 29.1 kU/L — ABNORMAL HIGH
GOLDENROD: 17.2 kU/L — AB
Helminthosporium halodes: 0.33 kU/L — ABNORMAL HIGH
House Dust Hollister: 20.6 kU/L — ABNORMAL HIGH
LAMB'S QUARTERS CLASS: 31.5 kU/L — AB
Oak: 60.6 kU/L — ABNORMAL HIGH
Plantain: 26.8 kU/L — ABNORMAL HIGH
Stemphylium Botryosum: 0.46 kU/L — ABNORMAL HIGH
Sycamore Tree: 23.2 kU/L — ABNORMAL HIGH
TIMOTHY GRASS: 43.7 kU/L — AB

## 2014-07-26 ENCOUNTER — Ambulatory Visit (INDEPENDENT_AMBULATORY_CARE_PROVIDER_SITE_OTHER): Payer: BC Managed Care – PPO | Admitting: Internal Medicine

## 2014-07-26 ENCOUNTER — Encounter: Payer: Self-pay | Admitting: Internal Medicine

## 2014-07-26 ENCOUNTER — Other Ambulatory Visit (INDEPENDENT_AMBULATORY_CARE_PROVIDER_SITE_OTHER): Payer: BC Managed Care – PPO

## 2014-07-26 VITALS — BP 110/62 | HR 68 | Ht 62.0 in | Wt 130.8 lb

## 2014-07-26 DIAGNOSIS — J4520 Mild intermittent asthma, uncomplicated: Secondary | ICD-10-CM

## 2014-07-26 DIAGNOSIS — Z91018 Allergy to other foods: Secondary | ICD-10-CM

## 2014-07-26 DIAGNOSIS — J45909 Unspecified asthma, uncomplicated: Secondary | ICD-10-CM

## 2014-07-26 DIAGNOSIS — D824 Hyperimmunoglobulin E [IgE] syndrome: Secondary | ICD-10-CM | POA: Insufficient documentation

## 2014-07-26 DIAGNOSIS — J309 Allergic rhinitis, unspecified: Secondary | ICD-10-CM

## 2014-07-26 DIAGNOSIS — D8989 Other specified disorders involving the immune mechanism, not elsewhere classified: Secondary | ICD-10-CM

## 2014-07-26 DIAGNOSIS — R21 Rash and other nonspecific skin eruption: Secondary | ICD-10-CM

## 2014-07-26 LAB — CBC WITH DIFFERENTIAL/PLATELET
BASOS ABS: 0 10*3/uL (ref 0.0–0.1)
Basophils Relative: 0.4 % (ref 0.0–3.0)
EOS ABS: 0.2 10*3/uL (ref 0.0–0.7)
Eosinophils Relative: 3 % (ref 0.0–5.0)
HCT: 43.2 % (ref 36.0–46.0)
Hemoglobin: 14.4 g/dL (ref 12.0–15.0)
LYMPHS PCT: 38 % (ref 12.0–46.0)
Lymphs Abs: 2.4 10*3/uL (ref 0.7–4.0)
MCHC: 33.5 g/dL (ref 30.0–36.0)
MCV: 87.2 fl (ref 78.0–100.0)
Monocytes Absolute: 0.5 10*3/uL (ref 0.1–1.0)
Monocytes Relative: 8.5 % (ref 3.0–12.0)
Neutro Abs: 3.2 10*3/uL (ref 1.4–7.7)
Neutrophils Relative %: 50.1 % (ref 43.0–77.0)
PLATELETS: 287 10*3/uL (ref 150.0–400.0)
RBC: 4.95 Mil/uL (ref 3.87–5.11)
RDW: 12.9 % (ref 11.5–15.5)
WBC: 6.4 10*3/uL (ref 4.0–10.5)

## 2014-07-26 LAB — SEDIMENTATION RATE: Sed Rate: 10 mm/hr (ref 0–22)

## 2014-07-26 NOTE — Assessment & Plan Note (Signed)
Faint macular rash which she says can be generalized and is more visible when she is hot. No specific recognized trigger. No new medicines or exposures that she recognizes. Appearance could certainly fit a drug rash. This has lasted a long time to be a viral rash. 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 Plan-additional labs

## 2014-07-26 NOTE — Assessment & Plan Note (Addendum)
Etiology and duration are unclear. In the past she has also had elevated eosinophils. Plan-expand lab. Consider referral would be appropriate. We discussed allergy skin testing as a way hopefully to clarify which allergens are important for her seasonal rhinitis

## 2014-07-26 NOTE — Patient Instructions (Signed)
Order- lab- CBC w diff, Serum protein electrophoresis, Immunoglobulins- IgG, IgA, IgM, sed rate, ANA    Dx rash, Hyper IgE  Schedule allergy skin tests- need to be off antihistamines like the levoctirizine/ Xyzal tablet for 3 days before skin testing

## 2014-07-26 NOTE — Assessment & Plan Note (Signed)
Cats caused chest tightness/shortness of breath

## 2014-07-26 NOTE — Progress Notes (Signed)
06/20/14- 39 yoF never smoker Self Referral-having trouble with cat and house dust(noticed when cleaning)-causes breathing issues. Usually has seasonal allergies (Spring and Fall). States allergies dx'd 40 yrs ago. Allergy skin testing followed by 2 years of allergy shots 10 yrs ago. Allergy vaccine seemed helpful first year, but then symptoms seemed no better next yea.  Spring and Fall sneeze, itching eyes, rhinorhea all worse in the last month this year. Some short of breath and wheeze helped by SABA rescyue inhaler used about twice/ year.  Triggers- Seasonal pollens, perennial exposure to dust, cats. Food- kiwi fruit- can make throat itch. No rash.  No hx aspirin intolerance, nasal polyps.  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 Blames this for some symptoms. Meds- prefers Xyzal over otc antihistamine   07/26/14- 57 yoF never smoker followed for allergic rhinitis, asthma, rash FOLLOWS FOR: has noticed that she recently having a rash come up after going to pool or in showers. She has some spots on her today to show. New rash seems to develop a few days after she was here last. No change in medications and no recognized food triggers. It is more visible after shower or at swimming pool. She had stopped xyzal in the spring. Cats make her short of breath. Nasal congestion in the spring and fall. Allergy profile 06/20/14-Total IgE 1829.1 with marked elevations for all specific allergens tested, especially high for dust mite. Elevated also for all foods tested Food Allergy Profile 06/20/14  ROS-see HPI Constitutional:   No-   weight loss, night sweats, fevers, chills, fatigue, lassitude. HEENT:   No-  headaches, difficulty swallowing, tooth/dental problems, sore throat,       + sneezing, +itching, ear ache, +nasal congestion, +post nasal drip,  CV:  No-   chest pain, orthopnea, PND, swelling in lower extremities,  anasarca,                                  dizziness, palpitations Resp: No-   shortness of breath with exertion or at rest.              No-   productive cough,  No non-productive cough,  No- coughing up of blood.              No-   change in color of mucus.  No- wheezing.   Skin: +new GI:  +heartburn, indigestion, abdominal pain, nausea, vomiting,  GU:  MS:  + joint pain or swelling.   Neuro-     nothing unusual Psych:  No- change in mood or affect. No depression or anxiety.  No memory loss.  OBJ- Physical Exam General- Alert, Oriented, Affect-appropriate, Distress- none acute Skin- +faint macular rash on the volar wrists without excoriation Lymphadenopathy- none Head- atraumatic            Eyes- Gross vision intact, PERRLA, conjunctivae and secretions clear            Ears- Hearing, canals-normal            Nose- Clear, no-Septal dev, mucus, polyps, erosion, perforation             Throat- Mallampati III-IV , mucosa clear , drainage- none, tonsils- atrophic Neck- flexible , trachea midline, no stridor , thyroid nl, carotid no bruit Chest - symmetrical excursion , unlabored  Heart/CV- RRR , no murmur , no gallop  , no rub, nl s1 s2                           - JVD- none , edema- none, stasis changes- none, varices- none           Lung- clear to P&A, wheeze- none, cough- none , dullness-none, rub- none           Chest wall-  Abd- HSM- none Br/ Gen/ Rectal- Not done, not indicated Extrem- cyanosis- none, clubbing, none, atrophy- none, strength- nl Neuro- grossly intact to observation

## 2014-07-26 NOTE — Assessment & Plan Note (Signed)
Elevated IgE. Direct connection to any specific allergens is unclear. Plan-only avoid foods which cause problems

## 2014-07-26 NOTE — Assessment & Plan Note (Addendum)
Seasonal rhinitis, spring and fall This doesn't correspond clearly to her hyper IgE pattern

## 2014-07-27 LAB — IGG, IGA, IGM
IGA: 233 mg/dL (ref 69–380)
IGM, SERUM: 39 mg/dL — AB (ref 52–322)
IgG (Immunoglobin G), Serum: 1010 mg/dL (ref 690–1700)

## 2014-07-27 LAB — ANA: Anti Nuclear Antibody(ANA): NEGATIVE

## 2014-07-27 LAB — IGE: IGE (IMMUNOGLOBULIN E), SERUM: 1313.4 [IU]/mL — AB (ref 0.0–180.0)

## 2014-08-01 ENCOUNTER — Telehealth: Payer: Self-pay | Admitting: Internal Medicine

## 2014-08-01 MED ORDER — MELOXICAM 15 MG PO TABS: 15.0000 mg | ORAL_TABLET | Freq: Every day | ORAL | Status: DC

## 2014-08-01 NOTE — Telephone Encounter (Signed)
Ok for nsaid - mobic, sent to [facility]

## 2014-08-01 NOTE — Telephone Encounter (Signed)
Pt request a phone call from the assistant before she make an appt. Please give pt a call.

## 2014-08-01 NOTE — Telephone Encounter (Signed)
The patient has an allergy to mobic.  She specifically has tried a medication that has worked for her and would like called in.  tofacitinib 5 mg,  Please.

## 2014-08-01 NOTE — Telephone Encounter (Signed)
Called the patient and she would like something either called in for arthritis or OTC to use.

## 2014-08-01 NOTE — Telephone Encounter (Signed)
Sorry, this medication is not in my usual practice; I can refer to rheumatology if she like

## 2014-08-02 ENCOUNTER — Telehealth: Payer: Self-pay | Admitting: *Deleted

## 2014-08-02 NOTE — Telephone Encounter (Signed)
Patient informed. 

## 2014-08-02 NOTE — Telephone Encounter (Signed)
Left msg to call back. Called pt back she is wanting md to give her rx for Tofacitinib 5mg . (see msg from 08/01/14) inform pt agin on md response. Dr. Jenny Reichmann is not familiar with this medication & he will not rx. Ask pt who rx med she stated she got the medication from a friend...Sierra English

## 2014-08-18 ENCOUNTER — Encounter: Payer: Self-pay | Admitting: Internal Medicine

## 2014-08-29 ENCOUNTER — Telehealth: Payer: Self-pay | Admitting: Internal Medicine

## 2014-08-29 NOTE — Telephone Encounter (Signed)
Pt is aware that she if okay to have skin testing tomorrow. Nothing more needed at this time.

## 2014-08-30 ENCOUNTER — Ambulatory Visit (INDEPENDENT_AMBULATORY_CARE_PROVIDER_SITE_OTHER): Payer: BC Managed Care – PPO | Admitting: Internal Medicine

## 2014-08-30 ENCOUNTER — Encounter: Payer: Self-pay | Admitting: Internal Medicine

## 2014-08-30 VITALS — BP 122/64 | HR 75 | Ht 62.0 in | Wt 136.4 lb

## 2014-08-30 DIAGNOSIS — H1045 Other chronic allergic conjunctivitis: Secondary | ICD-10-CM

## 2014-08-30 DIAGNOSIS — D8989 Other specified disorders involving the immune mechanism, not elsewhere classified: Secondary | ICD-10-CM

## 2014-08-30 DIAGNOSIS — D824 Hyperimmunoglobulin E [IgE] syndrome: Secondary | ICD-10-CM

## 2014-08-30 DIAGNOSIS — J309 Allergic rhinitis, unspecified: Secondary | ICD-10-CM

## 2014-08-30 DIAGNOSIS — H1013 Acute atopic conjunctivitis, bilateral: Secondary | ICD-10-CM

## 2014-08-30 NOTE — Patient Instructions (Signed)
We can start allergy vaccine. The allergy lab will call you when your vaccine is ready to start.  Ok to take your regular medicines, inlcuding antihistamines, as needed

## 2014-08-30 NOTE — Progress Notes (Signed)
06/20/14- 35 yoF never smoker Self Referral-having trouble with cat and house dust(noticed when cleaning)-causes breathing issues. Usually has seasonal allergies (Spring and Fall). States allergies dx'd 40 yrs ago. Allergy skin testing followed by 2 years of allergy shots 10 yrs ago. Allergy vaccine seemed helpful first year, but then symptoms seemed no better next yea.  Spring and Fall sneeze, itching eyes, rhinorhea all worse in the last month this year. Some short of breath and wheeze helped by SABA rescyue inhaler used about twice/ year.  Triggers- Seasonal pollens, perennial exposure to dust, cats. Food- kiwi fruit- can make throat itch. No rash.  No hx aspirin intolerance, nasal polyps.  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 Blames this for some symptoms. Meds- prefers Xyzal over otc antihistamine   07/26/14- 57 yoF never smoker followed for allergic rhinitis, asthma, rash FOLLOWS FOR: has noticed that she recently having a rash come up after going to pool or in showers. She has some spots on her today to show. New rash seems to develop a few days after she was here last. No change in medications and no recognized food triggers. It is more visible after shower or at swimming pool. She had stopped xyzal in the spring. Cats make her short of breath. Nasal congestion in the spring and fall. Allergy profile 06/20/14-Total IgE 1829.1 with marked elevations for all specific allergens tested, especially high for dust mite. Elevated also for all foods tested Food Allergy Profile 06/20/14  08/30/14- 20 yoF never smoker followed for allergic rhinitis, Hyper IgE, asthma, rash No antihistamines, OTC cough syrups, or OTC sleep aids in past 3 days- coming for allergy skin testing Complains of itching watery nose, worst in the fall. Testing in her home and exposure to cats make her short of breath but she does not wheeze  much IgE 07/26/14- total 1313, was 1829 previously Allergy skin testing 08/30/14- Strongly positive to most pollens and endodermals- not dog or molds Only puncture tests done. She wants to try allergy vaccine again  ROS-see HPI Constitutional:   No-   weight loss, night sweats, fevers, chills, fatigue, lassitude. HEENT:   No-  headaches, difficulty swallowing, tooth/dental problems, sore throat,       + sneezing, +itching, ear ache, +nasal congestion, +post nasal drip,  CV:  No-   chest pain, orthopnea, PND, swelling in lower extremities, anasarca,                                  dizziness, palpitations Resp: No-   shortness of breath with exertion or at rest.              No-   productive cough,  No non-productive cough,  No- coughing up of blood.              No-   change in color of mucus.  No- wheezing.   Skin: +new GI:  +heartburn, indigestion, abdominal pain, nausea, vomiting,  GU:  MS:  + joint pain or swelling.   Neuro-     nothing unusual Psych:  No- change in mood or affect. No depression or anxiety.  No memory loss.  OBJ- Physical Exam General- Alert, Oriented, Affect-appropriate, Distress- none acute Skin- +faint macular rash on the volar wrists without excoriation Lymphadenopathy- none Head- atraumatic            Eyes-  Gross vision intact, PERRLA, conjunctivae and secretions clear            Ears- Hearing, canals-normal            Nose- Clear, no-Septal dev, mucus, polyps, erosion, perforation             Throat- Mallampati III-IV , mucosa clear , drainage- none, tonsils- atrophic Neck- flexible , trachea midline, no stridor , thyroid nl, carotid no bruit Chest - symmetrical excursion , unlabored           Heart/CV- RRR , no murmur , no gallop  , no rub, nl s1 s2                           - JVD- none , edema- none, stasis changes- none, varices- none           Lung- clear to P&A, wheeze- none, cough- none , dullness-none, rub- none           Chest wall-  Abd-  Br/ Gen/  Rectal- Not done, not indicated Extrem- cyanosis- none, clubbing, none, atrophy- none, strength- nl Neuro- grossly intact to observation

## 2014-09-03 NOTE — Assessment & Plan Note (Signed)
Not clear how this relates to her rhinitis complaints

## 2014-09-03 NOTE — Assessment & Plan Note (Signed)
Despite her very high IgE levels, her primary complaints are of rhinitis. We have educated environmental precautions and reviewed her previous experience with antihistamines and nasal sprays. She wants to try again with allergy vaccine-risk benefit discussion done Plan-start allergy vaccine

## 2014-09-12 ENCOUNTER — Telehealth: Payer: Self-pay | Admitting: Internal Medicine

## 2014-09-12 NOTE — Telephone Encounter (Signed)
Tammy, please advise on allergy vaccine status, thanks!

## 2014-09-14 NOTE — Telephone Encounter (Signed)
Called pt. 09/14/14 to let her know I am making her vac. up today. She is coming tomorrow for her first shot.

## 2014-09-15 ENCOUNTER — Ambulatory Visit (INDEPENDENT_AMBULATORY_CARE_PROVIDER_SITE_OTHER): Payer: BC Managed Care – PPO

## 2014-09-15 DIAGNOSIS — J309 Allergic rhinitis, unspecified: Secondary | ICD-10-CM

## 2014-09-19 ENCOUNTER — Ambulatory Visit (INDEPENDENT_AMBULATORY_CARE_PROVIDER_SITE_OTHER): Payer: BC Managed Care – PPO

## 2014-09-19 DIAGNOSIS — J309 Allergic rhinitis, unspecified: Secondary | ICD-10-CM

## 2014-09-21 ENCOUNTER — Ambulatory Visit (INDEPENDENT_AMBULATORY_CARE_PROVIDER_SITE_OTHER): Payer: BC Managed Care – PPO

## 2014-09-21 DIAGNOSIS — J309 Allergic rhinitis, unspecified: Secondary | ICD-10-CM

## 2014-09-22 ENCOUNTER — Ambulatory Visit: Payer: BC Managed Care – PPO

## 2014-09-26 ENCOUNTER — Ambulatory Visit (INDEPENDENT_AMBULATORY_CARE_PROVIDER_SITE_OTHER): Payer: BC Managed Care – PPO

## 2014-09-26 DIAGNOSIS — J309 Allergic rhinitis, unspecified: Secondary | ICD-10-CM

## 2014-09-29 ENCOUNTER — Ambulatory Visit (INDEPENDENT_AMBULATORY_CARE_PROVIDER_SITE_OTHER): Payer: BC Managed Care – PPO

## 2014-09-29 DIAGNOSIS — J309 Allergic rhinitis, unspecified: Secondary | ICD-10-CM

## 2014-10-03 ENCOUNTER — Ambulatory Visit (INDEPENDENT_AMBULATORY_CARE_PROVIDER_SITE_OTHER): Payer: BC Managed Care – PPO

## 2014-10-03 DIAGNOSIS — J309 Allergic rhinitis, unspecified: Secondary | ICD-10-CM

## 2014-10-04 ENCOUNTER — Encounter: Payer: Self-pay | Admitting: Internal Medicine

## 2014-10-06 ENCOUNTER — Ambulatory Visit (INDEPENDENT_AMBULATORY_CARE_PROVIDER_SITE_OTHER): Payer: BC Managed Care – PPO

## 2014-10-06 DIAGNOSIS — J309 Allergic rhinitis, unspecified: Secondary | ICD-10-CM

## 2014-10-10 ENCOUNTER — Ambulatory Visit (INDEPENDENT_AMBULATORY_CARE_PROVIDER_SITE_OTHER): Payer: BC Managed Care – PPO

## 2014-10-10 DIAGNOSIS — J309 Allergic rhinitis, unspecified: Secondary | ICD-10-CM

## 2014-10-13 ENCOUNTER — Ambulatory Visit (INDEPENDENT_AMBULATORY_CARE_PROVIDER_SITE_OTHER): Payer: BC Managed Care – PPO

## 2014-10-13 DIAGNOSIS — J309 Allergic rhinitis, unspecified: Secondary | ICD-10-CM

## 2014-10-16 ENCOUNTER — Ambulatory Visit (INDEPENDENT_AMBULATORY_CARE_PROVIDER_SITE_OTHER): Payer: BC Managed Care – PPO

## 2014-10-16 DIAGNOSIS — J309 Allergic rhinitis, unspecified: Secondary | ICD-10-CM

## 2014-10-17 ENCOUNTER — Ambulatory Visit (INDEPENDENT_AMBULATORY_CARE_PROVIDER_SITE_OTHER): Payer: BC Managed Care – PPO

## 2014-10-17 DIAGNOSIS — J309 Allergic rhinitis, unspecified: Secondary | ICD-10-CM

## 2014-10-18 ENCOUNTER — Ambulatory Visit: Payer: BC Managed Care – PPO

## 2014-10-20 ENCOUNTER — Ambulatory Visit (INDEPENDENT_AMBULATORY_CARE_PROVIDER_SITE_OTHER): Payer: BC Managed Care – PPO

## 2014-10-20 DIAGNOSIS — J309 Allergic rhinitis, unspecified: Secondary | ICD-10-CM

## 2014-10-24 ENCOUNTER — Ambulatory Visit (INDEPENDENT_AMBULATORY_CARE_PROVIDER_SITE_OTHER): Payer: BC Managed Care – PPO

## 2014-10-24 DIAGNOSIS — J309 Allergic rhinitis, unspecified: Secondary | ICD-10-CM

## 2014-10-27 ENCOUNTER — Ambulatory Visit (INDEPENDENT_AMBULATORY_CARE_PROVIDER_SITE_OTHER): Payer: BC Managed Care – PPO

## 2014-10-27 DIAGNOSIS — J309 Allergic rhinitis, unspecified: Secondary | ICD-10-CM

## 2014-10-30 ENCOUNTER — Encounter: Payer: Self-pay | Admitting: Internal Medicine

## 2014-10-30 ENCOUNTER — Ambulatory Visit (INDEPENDENT_AMBULATORY_CARE_PROVIDER_SITE_OTHER): Payer: BC Managed Care – PPO

## 2014-10-30 ENCOUNTER — Ambulatory Visit (INDEPENDENT_AMBULATORY_CARE_PROVIDER_SITE_OTHER): Payer: BC Managed Care – PPO | Admitting: Internal Medicine

## 2014-10-30 VITALS — BP 118/62 | HR 72 | Ht 62.0 in | Wt 139.2 lb

## 2014-10-30 DIAGNOSIS — J309 Allergic rhinitis, unspecified: Secondary | ICD-10-CM

## 2014-10-30 DIAGNOSIS — J302 Other seasonal allergic rhinitis: Secondary | ICD-10-CM

## 2014-10-30 DIAGNOSIS — J3089 Other allergic rhinitis: Principal | ICD-10-CM

## 2014-10-30 DIAGNOSIS — J4520 Mild intermittent asthma, uncomplicated: Secondary | ICD-10-CM

## 2014-10-30 NOTE — Progress Notes (Signed)
06/20/14- 44 yoF never smoker Self Referral-having trouble with cat and house dust(noticed when cleaning)-causes breathing issues. Usually has seasonal allergies (Spring and Fall). States allergies dx'd 40 yrs ago. Allergy skin testing followed by 2 years of allergy shots 10 yrs ago. Allergy vaccine seemed helpful first year, but then symptoms seemed no better next yea.  Spring and Fall sneeze, itching eyes, rhinorhea all worse in the last month this year. Some short of breath and wheeze helped by SABA rescyue inhaler used about twice/ year.  Triggers- Seasonal pollens, perennial exposure to dust, cats. Food- kiwi fruit- can make throat itch. No rash.  No hx aspirin intolerance, nasal polyps.  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 Blames this for some symptoms. Meds- prefers Xyzal over otc antihistamine   07/26/14- 57 yoF never smoker followed for allergic rhinitis, asthma, rash FOLLOWS FOR: has noticed that she recently having a rash come up after going to pool or in showers. She has some spots on her today to show. New rash seems to develop a few days after she was here last. No change in medications and no recognized food triggers. It is more visible after shower or at swimming pool. She had stopped xyzal in the spring. Cats make her short of breath. Nasal congestion in the spring and fall. Allergy profile 06/20/14-Total IgE 1829.1 with marked elevations for all specific allergens tested, especially high for dust mite. Elevated also for all foods tested Food Allergy Profile 06/20/14  08/30/14- 35 yoF never smoker followed for allergic rhinitis, Hyper IgE, asthma, rash No antihistamines, OTC cough syrups, or OTC sleep aids in past 3 days- coming for allergy skin testing Complains of itching watery nose, worst in the fall. Testing in her home and exposure to cats make her short of breath but she does not wheeze  much IgE 07/26/14- total 1313, was 1829 previously Allergy skin testing 08/30/14- Strongly positive to most pollens and endodermals- not dog or molds Only puncture tests done. She wants to try allergy vaccine again  10/30/14- 58 yoF never smoker followed for allergic rhinitis, Hyper IgE, asthma, rash FOLLOWS FOR: on Allergy vaccine-build up now at 0.5 ml/ 1:5,000; pt got shot today and having itchy feelings-first time this has happened. No redness at area or throat swelling/wheezing. Minor local itch without heat or swelling. Discussed winter expectations  ROS-see HPI Constitutional:   No-   weight loss, night sweats, fevers, chills, fatigue, lassitude. HEENT:   No-  headaches, difficulty swallowing, tooth/dental problems, sore throat,       + sneezing, +itching, ear ache, +nasal congestion, +post nasal drip,  CV:  No-   chest pain, orthopnea, PND, swelling in lower extremities, anasarca,                                  dizziness, palpitations Resp: No-   shortness of breath with exertion or at rest.              No-   productive cough,  No non-productive cough,  No- coughing up of blood.              No-   change in color of mucus.  No- wheezing.   Skin: +new GI:  +heartburn, indigestion, abdominal pain, nausea, vomiting,  GU:  MS:  + joint pain or swelling.   Neuro-     nothing  unusual Psych:  No- change in mood or affect. No depression or anxiety.  No memory loss.  OBJ- Physical Exam General- Alert, Oriented, Affect-appropriate, Distress- none acute Skin- +faint erythema at R arm allergy inj site. Lymphadenopathy- none Head- atraumatic            Eyes- Gross vision intact, PERRLA, conjunctivae and secretions clear            Ears- Hearing, canals-normal            Nose- Clear, no-Septal dev, mucus, polyps, erosion, perforation             Throat- Mallampati III-IV , mucosa clear , drainage- none, tonsils- atrophic Neck- flexible , trachea midline, no stridor , thyroid nl, carotid no  bruit Chest - symmetrical excursion , unlabored           Heart/CV- RRR , no murmur , no gallop  , no rub, nl s1 s2                           - JVD- none , edema- none, stasis changes- none, varices- none           Lung- clear to P&A, wheeze- none, cough- none , dullness-none, rub- none           Chest wall-  Abd-  Br/ Gen/ Rectal- Not done, not indicated Extrem- cyanosis- none, clubbing, none, atrophy- none, strength- nl Neuro- grossly intact to observation

## 2014-10-30 NOTE — Patient Instructions (Signed)
We can continue allergy vaccine buildup. Please let the allergy nurse know what sort of reactions you have, so that she can make changes as needed.  It is ok to use an antihistamine like your Xyzal, or claritin etc, as needed  For itching eyes, try otc Naphcon or Visine allergy eye drops as needed

## 2014-10-30 NOTE — Assessment & Plan Note (Signed)
Controlled.  

## 2014-10-30 NOTE — Assessment & Plan Note (Signed)
Uncomplicated vaccine buildup. Minor local reaction. We discussed going forward.. She will work with allergy lab to adjust dose as tolerated.

## 2014-11-02 ENCOUNTER — Ambulatory Visit (INDEPENDENT_AMBULATORY_CARE_PROVIDER_SITE_OTHER): Payer: BC Managed Care – PPO

## 2014-11-02 DIAGNOSIS — J309 Allergic rhinitis, unspecified: Secondary | ICD-10-CM

## 2014-11-06 ENCOUNTER — Ambulatory Visit (INDEPENDENT_AMBULATORY_CARE_PROVIDER_SITE_OTHER): Payer: BC Managed Care – PPO

## 2014-11-06 ENCOUNTER — Ambulatory Visit: Payer: BC Managed Care – PPO | Admitting: Internal Medicine

## 2014-11-06 DIAGNOSIS — J309 Allergic rhinitis, unspecified: Secondary | ICD-10-CM

## 2014-11-08 ENCOUNTER — Ambulatory Visit (INDEPENDENT_AMBULATORY_CARE_PROVIDER_SITE_OTHER): Payer: BC Managed Care – PPO

## 2014-11-08 ENCOUNTER — Ambulatory Visit (INDEPENDENT_AMBULATORY_CARE_PROVIDER_SITE_OTHER): Payer: BC Managed Care – PPO | Admitting: Family

## 2014-11-08 ENCOUNTER — Encounter: Payer: Self-pay | Admitting: Family

## 2014-11-08 DIAGNOSIS — Z23 Encounter for immunization: Secondary | ICD-10-CM

## 2014-11-08 DIAGNOSIS — R1031 Right lower quadrant pain: Secondary | ICD-10-CM

## 2014-11-08 DIAGNOSIS — M5441 Lumbago with sciatica, right side: Secondary | ICD-10-CM

## 2014-11-08 NOTE — Assessment & Plan Note (Signed)
Idiopathic RLQ pain continues. Question possible hernia. Pt indicated GYN has already evaluated - results requested. Does have degenerative joint disease in lower back per previous CT. Cannot rule out radiculopathy. Obtain RLQ ultrasound. Continue current medications. Follow up post ultrasound result. Consider possible GI referral.

## 2014-11-08 NOTE — Assessment & Plan Note (Signed)
Previous CT shows degenerative joint disease. Patient explains current radiculopathy. Unable to recreate symptoms during evaluation. Continue Mobic for inflammation. Will consider medrol dose pack if symptoms worsen.

## 2014-11-08 NOTE — Patient Instructions (Addendum)
Thank you for choosing [facility].  Summary/Instructions:   Please start eating Activia on a daily basis.   You should hear back from our schedulers in about a week regarding scheduling your ultrasound.

## 2014-11-08 NOTE — Progress Notes (Signed)
Subjective:    Patient ID: Sierra English, female    DOB: 12/09/1956, 58 y.o.   MRN: [id number]  Chief Complaint  Patient presents with  . Establish Care    Lower abdomen and back pain,     HPI:  Sierra English is a 58 y.o. female who presents today to establish care with this provider and discuss her lower abdomen pain.  1) Abdominal pain - RLQ pain has been going on for about 1-2 years. Denies any trauma to the area. States that when she "carries heavy things" it increases her pain. Described as "feeling something" but when exercising it is sharp. Denies any changes in bowel or bladder habits. Has had previous history of constipation. Last night she seemed to have a bloating feeling.   2) Lower back - Lower back pain has been going on for a couple of weeks. Right now indicates that it is okay, but there are times that she has to lay down.   No Known Allergies  Current Outpatient Prescriptions on File Prior to Visit  Medication Sig Dispense Refill  . albuterol (PROVENTIL HFA;VENTOLIN HFA) 108 (90 BASE) MCG/ACT inhaler Inhale 2 puffs into the lungs every 6 (six) hours as needed for wheezing or shortness of breath. 1 Inhaler 11  . Black Cohosh 175 MG CAPS Take 1 capsule by mouth daily. menopause    . clonazePAM (KLONOPIN) 0.5 MG tablet Take 0.5 mg by mouth 2 (two) times daily as needed for anxiety.    . Coenzyme Q10 (COQ-10) 200 MG CAPS Take 1 capsule by mouth every morning.    Marland Kitchen ESTRACE VAGINAL 0.1 MG/GM vaginal cream     . GAVILYTE-G 236 G solution     . meloxicam (MOBIC) 15 MG tablet Take 1 tablet (15 mg total) by mouth daily. As needed for pain 90 tablet 1  . Multiple Vitamins-Minerals (EYE VITAMINS PO) Take by mouth daily.    . sertraline (ZOLOFT) 50 MG tablet Take 1 tablet (50 mg total) by mouth daily. 90 tablet 3  . SOOLANTRA 1 % CREA     . VITAMIN D, CHOLECALCIFEROL, PO Take by mouth daily.    Marland Kitchen XYZAL 5 MG tablet 1 daily at bedtime for allergy as directed 30 tablet  11   No current facility-administered medications on file prior to visit.    Review of Systems    See HPI  Objective:    BP 128/62 mmHg  Pulse 73  Temp(Src) 98.4 F (36.9 C) (Oral)  Resp 18  Ht 5\' 2"  (1.575 m)  Wt 135 lb 12.8 oz (61.598 kg)  BMI 24.83 kg/m2  SpO2 98% Nursing note and vital signs reviewed.  Physical Exam  Constitutional: She is oriented to person, place, and time. She appears well-developed and well-nourished. No distress.  Cardiovascular: Normal rate, regular rhythm and normal heart sounds.   Pulmonary/Chest: Effort normal and breath sounds normal.  Abdominal: Soft. Bowel sounds are normal. She exhibits no distension and no mass. There is no tenderness. There is no rebound and no guarding.  Unable to elicit tenderness upon palpation.   Musculoskeletal:  No obvious deformity, discoloration or edema of spine noted. No tenderness elicited. Sensation, pulses and reflexes are present and appropriate bilaterally.   Neurological: She is alert and oriented to person, place, and time.  Skin: Skin is warm and dry.  Psychiatric: She has a normal mood and affect. Her behavior is normal. Judgment and thought content normal.       Assessment &  Plan:

## 2014-11-08 NOTE — Progress Notes (Signed)
Pre visit review using our clinic review tool, if applicable. No additional management support is needed unless otherwise documented below in the visit note. 

## 2014-11-09 ENCOUNTER — Ambulatory Visit (INDEPENDENT_AMBULATORY_CARE_PROVIDER_SITE_OTHER): Payer: BC Managed Care – PPO

## 2014-11-09 DIAGNOSIS — J309 Allergic rhinitis, unspecified: Secondary | ICD-10-CM

## 2014-11-13 ENCOUNTER — Ambulatory Visit (INDEPENDENT_AMBULATORY_CARE_PROVIDER_SITE_OTHER): Payer: BC Managed Care – PPO

## 2014-11-13 ENCOUNTER — Telehealth: Payer: Self-pay | Admitting: Family

## 2014-11-13 DIAGNOSIS — J309 Allergic rhinitis, unspecified: Secondary | ICD-10-CM

## 2014-11-13 NOTE — Telephone Encounter (Signed)
Pt returned call and wanted to know with her neck pain and leg numbness, what would be the best exercise machines to use? There was a language barrier but she also requested a few medications to be sent in that she used to take for sleep. Mirtazapine and I think alprazolam? There was a third medication but I couldn't make out the last one. Please advise.

## 2014-11-13 NOTE — Telephone Encounter (Signed)
Pt came by office stating that she forgot to ask specific questions during her last appt with [facility]. Please contact pt and advise.

## 2014-11-13 NOTE — Telephone Encounter (Signed)
Tried calling pt. Left a message for her to call me back.

## 2014-11-13 NOTE — Telephone Encounter (Signed)
Pt called and said that she had some question about her last visit she forgot to ask Please call her at home number

## 2014-11-14 ENCOUNTER — Telehealth: Payer: Self-pay | Admitting: Neurology

## 2014-11-14 NOTE — Telephone Encounter (Signed)
Pt called requesting to speak to a nurse regarding her current conditon. Please call pt # [phone]

## 2014-11-14 NOTE — Telephone Encounter (Signed)
There are no specific exercise machines that will help with her neck, the best is stretching in all directions. As far as the medications, we need to get clarification on which ones she is requesting. Try asking the brand names as opposed to generics, that may help with translation, or have her send [place] an email through [facility].

## 2014-11-14 NOTE — Telephone Encounter (Signed)
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  I'm not sure why she is requesting xanax, but please notify her to f/u with her PCP for anxiety/depression as I do not manage this.

## 2014-11-14 NOTE — Telephone Encounter (Signed)
Patient is going to the gym but she doesn't want to do anything to make her leg pain worse.  Is there any certain exercise she should avoid. ( example: bike, treadmill)

## 2014-11-14 NOTE — Telephone Encounter (Signed)
Patient notified

## 2014-11-14 NOTE — Telephone Encounter (Signed)
She is now asking for Rx for Xanax.  Please advise.

## 2014-11-14 NOTE — Telephone Encounter (Signed)
That's great that she is doing to the gym and she should be able to do any of the exercises she chooses.  If her pain is worse with any particular exercise, recommend avoiding it, but for now, she has no restrictions.  Sierra K. Posey Pronto, DO

## 2014-11-14 NOTE — Telephone Encounter (Signed)
Patient informed and agreed to call PCP.

## 2014-11-15 ENCOUNTER — Ambulatory Visit (INDEPENDENT_AMBULATORY_CARE_PROVIDER_SITE_OTHER): Payer: BC Managed Care – PPO

## 2014-11-15 DIAGNOSIS — J309 Allergic rhinitis, unspecified: Secondary | ICD-10-CM

## 2014-11-15 MED ORDER — MIRTAZAPINE 15 MG PO TABS: 15.0000 mg | ORAL_TABLET | Freq: Every day | ORAL | Status: DC

## 2014-11-15 MED ORDER — ALPRAZOLAM 0.25 MG PO TABS: 0.2500 mg | ORAL_TABLET | Freq: Two times a day (BID) | ORAL | Status: DC | PRN

## 2014-11-15 NOTE — Telephone Encounter (Signed)
Called pt to let her know xanax prescription is ready for pick up

## 2014-11-15 NOTE — Telephone Encounter (Signed)
Prescription printed and available for her to pick up.

## 2014-11-15 NOTE — Telephone Encounter (Signed)
The medications that she would like sent in are xanax, alprazolam, and mirtazapine. She said that she has had those medications in the past and they seem to have helped her with stress, anxiety, and sleep. She said she lost her son and she's under a lot of stress. Can she be put back on these medications? Please advise.

## 2014-11-15 NOTE — Telephone Encounter (Signed)
Pt is stating that she does not take klonopin and is very adamant on having the xanax or "alprazolam" there is a language barrier she does not understand that those are the same thing. She wants to know why she can not have xanax. Please advise

## 2014-11-15 NOTE — Telephone Encounter (Signed)
Please call patient and inform her I have sent in the prescription for the mirtazapine. I will not fill the xanax as she is already taking the klonopin and that is a medication in the same class as xanax. Please also inform her that if she develops increased thoughts of suicide to stop taking the medication and seek emergency medical care.

## 2014-11-16 ENCOUNTER — Ambulatory Visit (INDEPENDENT_AMBULATORY_CARE_PROVIDER_SITE_OTHER): Payer: BC Managed Care – PPO

## 2014-11-16 DIAGNOSIS — J309 Allergic rhinitis, unspecified: Secondary | ICD-10-CM

## 2014-11-17 ENCOUNTER — Other Ambulatory Visit: Payer: Self-pay | Admitting: Family

## 2014-11-17 ENCOUNTER — Ambulatory Visit
Admission: RE | Admit: 2014-11-17 | Discharge: 2014-11-17 | Disposition: A | Discharge status: Home / Self Care | Payer: BC Managed Care – PPO | Source: Ambulatory Visit | Attending: Family | Admitting: Family

## 2014-11-17 DIAGNOSIS — R1031 Right lower quadrant pain: Secondary | ICD-10-CM

## 2014-11-20 ENCOUNTER — Telehealth: Payer: Self-pay | Admitting: Family

## 2014-11-20 ENCOUNTER — Ambulatory Visit (INDEPENDENT_AMBULATORY_CARE_PROVIDER_SITE_OTHER): Payer: BC Managed Care – PPO

## 2014-11-20 ENCOUNTER — Ambulatory Visit: Payer: BC Managed Care – PPO

## 2014-11-20 ENCOUNTER — Encounter: Payer: Self-pay | Admitting: Internal Medicine

## 2014-11-20 ENCOUNTER — Telehealth: Payer: Self-pay | Admitting: Neurology

## 2014-11-20 DIAGNOSIS — J309 Allergic rhinitis, unspecified: Secondary | ICD-10-CM

## 2014-11-20 MED ORDER — LIDOCAINE 5 % EX OINT: TOPICAL_OINTMENT | CUTANEOUS | Status: DC

## 2014-11-20 NOTE — Telephone Encounter (Signed)
Let's try lidocaine ointment to her thigh, Rx sent in.

## 2014-11-20 NOTE — Telephone Encounter (Signed)
Patient requesting Rx for increased pain Right Thigh.  Patient has appointment scheduled 12/06/14 @ 12:00.

## 2014-11-20 NOTE — Telephone Encounter (Signed)
I called the patient. The patient is having increasing burning in her right thigh from the meralgia paresthetica. Previously, it was mainly numbness not pain. The patient may need to go on medications, possibly be set up for nerve blocks for the nerve. The patient will be seen on December 9.

## 2014-11-20 NOTE — Telephone Encounter (Signed)
Spoke to this patient and she wants something for the burning in her right thigh that has been getting worse the last week.  She saw Dr. Posey Pronto in June and also called her office for something, but she wants to come back and see Dr. Jannifer [place].  She was last seen 01-06-12 and has a follow up on 12-06-14.  Please advise.

## 2014-11-20 NOTE — Telephone Encounter (Signed)
Pt called, says her leg is burning. Please call back at [phone] / Sherri S.

## 2014-11-20 NOTE — Telephone Encounter (Signed)
Please call patient and inform her that her ultrasound was normal. If the pain continues we can get a CT scan of her abdomen. I am also not sure if this could be potentially related to her back in any way.

## 2014-11-20 NOTE — Telephone Encounter (Signed)
Right thigh is burning worse than it was even when she is lying down.  Any suggestions?

## 2014-11-21 NOTE — Telephone Encounter (Signed)
Left msg on triage requesting results back from [place] Called pt back gave her Marya Amsler response below. Pt is wanting her bone density fax to her gyn Dr. Dellis Filbert. Inform pt will fax...Johny Chess

## 2014-11-21 NOTE — Telephone Encounter (Signed)
Patient is going to try the ointment and then call back to schedule a follow up appointment.

## 2014-11-24 ENCOUNTER — Ambulatory Visit (INDEPENDENT_AMBULATORY_CARE_PROVIDER_SITE_OTHER): Payer: BC Managed Care – PPO

## 2014-11-24 DIAGNOSIS — J309 Allergic rhinitis, unspecified: Secondary | ICD-10-CM

## 2014-11-27 ENCOUNTER — Ambulatory Visit (INDEPENDENT_AMBULATORY_CARE_PROVIDER_SITE_OTHER): Payer: BC Managed Care – PPO

## 2014-11-27 DIAGNOSIS — J309 Allergic rhinitis, unspecified: Secondary | ICD-10-CM

## 2014-11-30 ENCOUNTER — Telehealth: Payer: Self-pay | Admitting: Internal Medicine

## 2014-11-30 ENCOUNTER — Ambulatory Visit (INDEPENDENT_AMBULATORY_CARE_PROVIDER_SITE_OTHER): Payer: BC Managed Care – PPO

## 2014-11-30 DIAGNOSIS — J309 Allergic rhinitis, unspecified: Secondary | ICD-10-CM

## 2014-11-30 NOTE — Telephone Encounter (Signed)
Mrs.Stieber had a reaction 11-16-14,you asked [place] to repeat the dose and we did

## 2014-12-01 NOTE — Telephone Encounter (Signed)
Try both splitting vials and going to 1 visit/week. Please let me know how she does.

## 2014-12-01 NOTE — Telephone Encounter (Signed)
We repeated 0.1 of 1:500 11/20/14 as you asked.(itching&redness) Nov./27/15 0.1 was repeated again with some itching. Nov./30/15 we took her to.15 per pt. red,warm &itchy.(The site was still itching when she came and got her shot 11/30/14. I gave in her lt. Arm this time and went back to 0.1. Should we have her come in once a wk. Instead of twice or do you want [place] to split the vials or both? Please advise. Pt. Is coning in Mon.11/04/14. Thanks, [employer]

## 2014-12-01 NOTE — Telephone Encounter (Signed)
I will let pt. Know Mon. When she comes in I will advise her of Dr.Young's recs.Marland Kitchen

## 2014-12-04 ENCOUNTER — Ambulatory Visit (INDEPENDENT_AMBULATORY_CARE_PROVIDER_SITE_OTHER): Payer: BC Managed Care – PPO

## 2014-12-04 DIAGNOSIS — J309 Allergic rhinitis, unspecified: Secondary | ICD-10-CM

## 2014-12-06 ENCOUNTER — Ambulatory Visit (INDEPENDENT_AMBULATORY_CARE_PROVIDER_SITE_OTHER): Payer: BC Managed Care – PPO | Admitting: Neurology

## 2014-12-06 ENCOUNTER — Encounter: Payer: Self-pay | Admitting: Neurology

## 2014-12-06 VITALS — BP 117/72 | HR 63 | Ht 64.0 in

## 2014-12-06 DIAGNOSIS — G5711 Meralgia paresthetica, right lower limb: Secondary | ICD-10-CM

## 2014-12-06 MED ORDER — CARBAMAZEPINE 200 MG PO TABS: ORAL_TABLET | ORAL | Status: DC

## 2014-12-06 NOTE — Patient Instructions (Signed)

## 2014-12-06 NOTE — Progress Notes (Signed)
Reason for visit: Meralgia paresthetica  Sierra English is an 58 y.o. female  History of present illness:  Sierra English is a 58 year old right-handed female with a history of meralgia paresthetica dating back to around 2011. The patient was seen through this office in 2013, but at that time, she was not having any pain, only numbness in the anterolateral aspect of her right thigh. She reports no back pain or weakness of the extremities. Within the last several months, she has developed some burning dysesthesias on the right anterolateral aspect of the thigh. She has problems sleeping at night because of the pain. She indicates that prolonged walking may also exacerbate the discomfort. She comes to this office for further evaluation.  Past Medical History  Diagnosis Date  . ALLERGIC RHINITIS 08/29/2010  . ANXIETY 08/29/2010  . [place], [place] 08/29/2010  . CHEST PAIN 01/29/2011  . COLONIC POLYPS, HX OF 08/29/2010  . HYPERTENSION 08/29/2010  . Low back pain 05/27/2011  . MACULAR DEGENERATION 08/29/2010  . PARESTHESIA 01/29/2011  . THORACIC/LUMBOSACRAL NEURITIS/RADICULITIS UNSPEC 02/28/2011    Past Surgical History  Procedure Laterality Date  . Cholecystectomy      Family History  Problem Relation Age of Onset  . Arthritis Mother   . Hypertension Father   . Lung cancer Father   . Breast cancer Sister 12  . Heart disease Sister     Social history:  reports that she has never smoked. She has never used smokeless tobacco. She reports that she does not drink alcohol or use illicit drugs.   No Known Allergies  Medications:  Current Outpatient Prescriptions on File Prior to Visit  Medication Sig Dispense Refill  . albuterol (PROVENTIL HFA;VENTOLIN HFA) 108 (90 BASE) MCG/ACT inhaler Inhale 2 puffs into the lungs every 6 (six) hours as needed for wheezing or shortness of breath. 1 Inhaler 11  . ALPRAZolam (XANAX) 0.25 MG tablet Take 1 tablet (0.25 mg total) by mouth 2 (two) times daily as  needed for anxiety. 30 tablet 0  . Black Cohosh 175 MG CAPS Take 1 capsule by mouth daily. menopause    . Coenzyme Q10 (COQ-10) 200 MG CAPS Take 1 capsule by mouth every morning.    Marland Kitchen ESTRACE VAGINAL 0.1 MG/GM vaginal cream     . GAVILYTE-G 236 G solution     . lidocaine (XYLOCAINE) 5 % ointment Use gloves to apply 2-3 times daily to thigh as needed. 50 g 2  . meloxicam (MOBIC) 15 MG tablet Take 1 tablet (15 mg total) by mouth daily. As needed for pain 90 tablet 1  . mirtazapine (REMERON) 15 MG tablet Take 1 tablet (15 mg total) by mouth at bedtime. 30 tablet 0  . Multiple Vitamins-Minerals (EYE VITAMINS PO) Take by mouth daily.    . sertraline (ZOLOFT) 50 MG tablet Take 1 tablet (50 mg total) by mouth daily. 90 tablet 3  . SOOLANTRA 1 % CREA     . VITAMIN D, CHOLECALCIFEROL, PO Take by mouth daily.    Marland Kitchen XYZAL 5 MG tablet 1 daily at bedtime for allergy as directed 30 tablet 11   No current facility-administered medications on file prior to visit.    ROS:  Out of a complete 14 system review of symptoms, the patient complains only of the following symptoms, and all other reviewed systems are negative.  Blurred vision, eye itching Flushing Restless legs, insomnia, frequent waking Food allergies Achy muscles, walking difficulty, neck pain, neck stiffness Numbness, right thigh Anxiety  Blood pressure 117/72, pulse 63, height 5\' 4"  (1.626 m).  Physical Exam  General: The patient is alert and cooperative at the time of the examination.  Skin: No significant peripheral edema is noted.   Neurologic Exam  Mental status: The patient is oriented x 3.  Cranial nerves: Facial symmetry is present. Speech is normal, no aphasia or dysarthria is noted. Extraocular movements are full. Visual fields are full.  Motor: The patient has good strength in all 4 extremities.  Sensory examination: Soft touch sensation is symmetric on the face, arms, and legs with exception that there are some  dysesthesias in the anterolateral aspect of the right thigh was soft touch.  Coordination: The patient has good finger-nose-finger and heel-to-shin bilaterally.  Gait and station: The patient has a normal gait. Tandem gait is normal. Romberg is negative. No drift is seen.  Reflexes: Deep tendon reflexes are symmetric.   Assessment/Plan:  1. Meralgia paresthetica, right  The patient continues to have some discomfort in the right anterolateral aspect of the thigh. She is somewhat unsure exactly how she wants this to be treated. I will initiate carbamazepine, and I will send her to a pain center for an injection of the right lateral femoral cutaneous nerve. The patient will follow-up in 4 or 5 months.  Sierra Alexanders MD 12/06/2014 8:04 PM  [facility] [address] [place], [place] [address]  Phone [phone] Fax [phone]

## 2014-12-07 ENCOUNTER — Telehealth: Payer: Self-pay | Admitting: Neurology

## 2014-12-07 NOTE — Telephone Encounter (Signed)
I called the patient. The patient had some questions about the nerve block procedure for the meralgia paresthetica on the right. The patient is willing to try the procedure. The referral has been made.

## 2014-12-07 NOTE — Telephone Encounter (Signed)
Patient has additional questions about Steroid injections.  Please call and advise.

## 2014-12-08 ENCOUNTER — Telehealth: Payer: Self-pay | Admitting: Neurology

## 2014-12-08 NOTE — Telephone Encounter (Signed)
I called patient back and she kept saying that we had talked about an injection.  Looking back on her notes it looks like she had talked to Dr. Jannifer [place] ([facility]) about an injection.  I told her we would get back to her on Monday.

## 2014-12-08 NOTE — Telephone Encounter (Signed)
Pt called wanting to speak to a nurse regarding the numbness she is having on her right leg.  Pt would like to discuss what type of injection Dr. Posey Pronto advice she could have. Please call pt # [phone]

## 2014-12-11 ENCOUNTER — Ambulatory Visit (INDEPENDENT_AMBULATORY_CARE_PROVIDER_SITE_OTHER): Payer: BC Managed Care – PPO

## 2014-12-11 DIAGNOSIS — J309 Allergic rhinitis, unspecified: Secondary | ICD-10-CM

## 2014-12-11 NOTE — Telephone Encounter (Signed)
Yes, patient most recently was seen by Dr. Jannifer [place] at [facility].  Please ask the patient to contact his office for questions.  Donika K. Posey Pronto, DO

## 2014-12-11 NOTE — Telephone Encounter (Signed)
Left message for patient that Dr. Posey Pronto does not want to do injection and that it must be Dr. Jannifer [place]' office that she needs to contact.

## 2014-12-18 ENCOUNTER — Ambulatory Visit (INDEPENDENT_AMBULATORY_CARE_PROVIDER_SITE_OTHER): Payer: BC Managed Care – PPO

## 2014-12-18 DIAGNOSIS — J309 Allergic rhinitis, unspecified: Secondary | ICD-10-CM

## 2014-12-25 ENCOUNTER — Ambulatory Visit (INDEPENDENT_AMBULATORY_CARE_PROVIDER_SITE_OTHER): Payer: BC Managed Care – PPO

## 2014-12-25 DIAGNOSIS — J309 Allergic rhinitis, unspecified: Secondary | ICD-10-CM

## 2015-01-01 ENCOUNTER — Ambulatory Visit: Payer: BC Managed Care – PPO

## 2015-01-03 ENCOUNTER — Encounter: Payer: Self-pay | Admitting: Internal Medicine

## 2015-01-08 ENCOUNTER — Ambulatory Visit (INDEPENDENT_AMBULATORY_CARE_PROVIDER_SITE_OTHER): Payer: BLUE CROSS/BLUE SHIELD

## 2015-01-08 DIAGNOSIS — J309 Allergic rhinitis, unspecified: Secondary | ICD-10-CM

## 2015-01-15 ENCOUNTER — Ambulatory Visit (INDEPENDENT_AMBULATORY_CARE_PROVIDER_SITE_OTHER): Payer: BLUE CROSS/BLUE SHIELD

## 2015-01-15 DIAGNOSIS — J309 Allergic rhinitis, unspecified: Secondary | ICD-10-CM

## 2015-01-22 ENCOUNTER — Ambulatory Visit (INDEPENDENT_AMBULATORY_CARE_PROVIDER_SITE_OTHER): Payer: BLUE CROSS/BLUE SHIELD

## 2015-01-22 DIAGNOSIS — J309 Allergic rhinitis, unspecified: Secondary | ICD-10-CM | POA: Diagnosis not present

## 2015-01-29 ENCOUNTER — Ambulatory Visit (INDEPENDENT_AMBULATORY_CARE_PROVIDER_SITE_OTHER): Payer: BLUE CROSS/BLUE SHIELD

## 2015-01-29 DIAGNOSIS — J309 Allergic rhinitis, unspecified: Secondary | ICD-10-CM

## 2015-02-05 ENCOUNTER — Ambulatory Visit (INDEPENDENT_AMBULATORY_CARE_PROVIDER_SITE_OTHER): Payer: BLUE CROSS/BLUE SHIELD

## 2015-02-05 DIAGNOSIS — J309 Allergic rhinitis, unspecified: Secondary | ICD-10-CM

## 2015-02-12 ENCOUNTER — Ambulatory Visit: Payer: BLUE CROSS/BLUE SHIELD

## 2015-02-14 ENCOUNTER — Ambulatory Visit (INDEPENDENT_AMBULATORY_CARE_PROVIDER_SITE_OTHER): Payer: BLUE CROSS/BLUE SHIELD

## 2015-02-14 DIAGNOSIS — J309 Allergic rhinitis, unspecified: Secondary | ICD-10-CM

## 2015-02-20 ENCOUNTER — Encounter: Payer: Self-pay | Admitting: *Deleted

## 2015-02-21 ENCOUNTER — Ambulatory Visit (INDEPENDENT_AMBULATORY_CARE_PROVIDER_SITE_OTHER): Payer: BLUE CROSS/BLUE SHIELD

## 2015-02-21 DIAGNOSIS — J309 Allergic rhinitis, unspecified: Secondary | ICD-10-CM

## 2015-02-22 ENCOUNTER — Ambulatory Visit: Payer: BLUE CROSS/BLUE SHIELD

## 2015-02-27 ENCOUNTER — Encounter: Payer: Self-pay | Admitting: General Surgery

## 2015-02-27 ENCOUNTER — Ambulatory Visit (INDEPENDENT_AMBULATORY_CARE_PROVIDER_SITE_OTHER): Payer: BLUE CROSS/BLUE SHIELD | Admitting: General Surgery

## 2015-02-27 VITALS — BP 124/70 | HR 74 | Resp 12 | Ht 62.0 in | Wt 132.0 lb

## 2015-02-27 DIAGNOSIS — R928 Other abnormal and inconclusive findings on diagnostic imaging of breast: Secondary | ICD-10-CM

## 2015-02-27 NOTE — Progress Notes (Signed)
Patient ID: Sierra English, female   DOB: September 25, 1956, 59 y.o.   MRN: [id number]  Chief Complaint  Patient presents with  . Other    left breast mass    HPI Sierra English is a 59 y.o. female who presents for an evaluation of a left breast mass. The patient denies any problems with the breasts. She states she checks her breast on a regular basis and regular mammograms done. Her most recent mammogram was done.   HPI  Past Medical History  Diagnosis Date  . ALLERGIC RHINITIS 08/29/2010  . ANXIETY 08/29/2010  . [place], [place] 08/29/2010  . CHEST PAIN 01/29/2011  . COLONIC POLYPS, HX OF 08/29/2010  . HYPERTENSION 08/29/2010  . Low back pain 05/27/2011  . MACULAR DEGENERATION 08/29/2010  . PARESTHESIA 01/29/2011  . THORACIC/LUMBOSACRAL NEURITIS/RADICULITIS UNSPEC 02/28/2011  . Hyperlipidemia     Past Surgical History  Procedure Laterality Date  . Cholecystectomy      Family History  Problem Relation Age of Onset  . Arthritis Mother   . Hypertension Father   . Lung cancer Father   . Breast cancer Sister 66  . Heart disease Sister     Social History History  Substance Use Topics  . Smoking status: Never Smoker   . Smokeless tobacco: Never Used  . Alcohol Use: No    Allergies  Allergen Reactions  . Lidocaine Palpitations    Current Outpatient Prescriptions  Medication Sig Dispense Refill  . albuterol (PROVENTIL HFA;VENTOLIN HFA) 108 (90 BASE) MCG/ACT inhaler Inhale 2 puffs into the lungs every 6 (six) hours as needed for wheezing or shortness of breath. 1 Inhaler 11  . Black Cohosh 175 MG CAPS Take 1 capsule by mouth daily. menopause    . Multiple Vitamins-Minerals (EYE VITAMINS PO) Take by mouth daily.    Marland Kitchen XYZAL 5 MG tablet 1 daily at bedtime for allergy as directed 30 tablet 11  . ALPRAZolam (XANAX) 0.25 MG tablet Take 1 tablet (0.25 mg total) by mouth 2 (two) times daily as needed for anxiety. 30 tablet 0  . carbamazepine (TEGRETOL) 200 MG tablet One half tablet twice  daily for 2 weeks, then take 1 tablet twice daily 60 tablet 3  . Coenzyme Q10 (COQ-10) 200 MG CAPS Take 1 capsule by mouth every morning.    Marland Kitchen ESTRACE VAGINAL 0.1 MG/GM vaginal cream     . GAVILYTE-G 236 G solution     . lidocaine (XYLOCAINE) 5 % ointment Use gloves to apply 2-3 times daily to thigh as needed. 50 g 2  . meloxicam (MOBIC) 15 MG tablet Take 1 tablet (15 mg total) by mouth daily. As needed for pain 90 tablet 1  . mirtazapine (REMERON) 15 MG tablet Take 1 tablet (15 mg total) by mouth at bedtime. 30 tablet 0  . sertraline (ZOLOFT) 50 MG tablet Take 1 tablet (50 mg total) by mouth daily. 90 tablet 3  . SOOLANTRA 1 % CREA     . VITAMIN D, CHOLECALCIFEROL, PO Take by mouth daily.     No current facility-administered medications for this visit.    Review of Systems Review of Systems  Constitutional: Negative.   Respiratory: Negative.   Cardiovascular: Negative.     Blood pressure 124/70, pulse 74, resp. rate 12, height 5\' 2"  (1.575 m), weight 132 lb (59.875 kg).  Physical Exam Physical Exam  Constitutional: She is oriented to person, place, and time. She appears well-developed and well-nourished.  Eyes: Conjunctivae are normal. No scleral icterus.  Neck:  Neck supple. No thyromegaly present.  Cardiovascular: Normal rate, regular rhythm and normal heart sounds.   No murmur heard. Pulmonary/Chest: Effort normal and breath sounds normal. Right breast exhibits no inverted nipple, no mass, no nipple discharge, no skin change and no tenderness. Left breast exhibits no inverted nipple, no mass, no nipple discharge, no skin change and no tenderness.  Abdominal: Soft. There is no tenderness.  Lymphadenopathy:    She has no cervical adenopathy.    She has no axillary adenopathy.  Neurological: She is alert and oriented to person, place, and time.  Skin: Skin is warm and dry.    Data Reviewed Mammogram and [place] reports were obtained. There is reported a lymph node 4mm size in  lateral left breast mid depth. No films were available.  Assessment    Abnormal imaging with a left breast mass suspected to be a lymph node. It is now only 2 mos since the imaging.      Plan    Discussed fully with pt. Will get the imaging films on disc for review. She will return in in 2-3 weeks for a f/u US to assess any changes         Nora Rooke G 02/28/2015, 1:22 PM

## 2015-02-27 NOTE — Patient Instructions (Addendum)
Patient to bring in disc of mammogram and ultrasound pictures when she returns in 2-3. She is also asked to bring in medication bottles at next visit.

## 2015-02-28 ENCOUNTER — Encounter: Payer: Self-pay | Admitting: General Surgery

## 2015-02-28 ENCOUNTER — Ambulatory Visit (INDEPENDENT_AMBULATORY_CARE_PROVIDER_SITE_OTHER): Payer: BLUE CROSS/BLUE SHIELD

## 2015-02-28 DIAGNOSIS — J309 Allergic rhinitis, unspecified: Secondary | ICD-10-CM

## 2015-03-07 ENCOUNTER — Ambulatory Visit (INDEPENDENT_AMBULATORY_CARE_PROVIDER_SITE_OTHER): Payer: BLUE CROSS/BLUE SHIELD

## 2015-03-07 DIAGNOSIS — J309 Allergic rhinitis, unspecified: Secondary | ICD-10-CM

## 2015-03-12 ENCOUNTER — Encounter: Payer: Self-pay | Admitting: Internal Medicine

## 2015-03-14 ENCOUNTER — Ambulatory Visit (INDEPENDENT_AMBULATORY_CARE_PROVIDER_SITE_OTHER): Payer: BLUE CROSS/BLUE SHIELD

## 2015-03-14 DIAGNOSIS — J309 Allergic rhinitis, unspecified: Secondary | ICD-10-CM

## 2015-03-19 ENCOUNTER — Telehealth: Payer: Self-pay | Admitting: Internal Medicine

## 2015-03-19 NOTE — Telephone Encounter (Signed)
lmomtcb x1 

## 2015-03-20 NOTE — Telephone Encounter (Signed)
lmtcb x2 

## 2015-03-21 ENCOUNTER — Ambulatory Visit: Payer: BLUE CROSS/BLUE SHIELD | Admitting: General Surgery

## 2015-03-21 ENCOUNTER — Ambulatory Visit (INDEPENDENT_AMBULATORY_CARE_PROVIDER_SITE_OTHER): Payer: BLUE CROSS/BLUE SHIELD

## 2015-03-21 DIAGNOSIS — J309 Allergic rhinitis, unspecified: Secondary | ICD-10-CM

## 2015-03-21 NOTE — Telephone Encounter (Signed)
Called and spoke to pt. Pt c/o rhinorrhea, itchy eyes, and sneezing. Pt stated she feels the allergy shots arent helping now and wondering if this is normal. Pt requesting recs by CY.   CY please advise.

## 2015-03-26 NOTE — Telephone Encounter (Signed)
Pt is calling about this. She wants call asap & is asking about seeing CY today - [phone]

## 2015-03-26 NOTE — Telephone Encounter (Signed)
Spoke with the pt and notified of recs per CDY  She verbalized understanding  Nothing further needed 

## 2015-03-26 NOTE — Telephone Encounter (Signed)
Tree pollen levels are very high and affecting every body now. Suggest continuing allergy shots- We will build strength as able. Suggest taking antihistamine twice daily:             Allegra 180 in the morning and otc chlortrimeton at bedtime Suggest adding otc allergy eye drop like Naphcon or Visine AC

## 2015-03-26 NOTE — Telephone Encounter (Signed)
Spoke with the pt  She is c/o rhinitis, itchy eyes and sneezing  She is taking allergy shots and also takes xyzal daily  She states that steroids help, but they make her BS increase too much  Please advise thanks  Allergies  Allergen Reactions  . Lidocaine Palpitations   Current Outpatient Prescriptions on File Prior to Visit  Medication Sig Dispense Refill  . albuterol (PROVENTIL HFA;VENTOLIN HFA) 108 (90 BASE) MCG/ACT inhaler Inhale 2 puffs into the lungs every 6 (six) hours as needed for wheezing or shortness of breath. 1 Inhaler 11  . ALPRAZolam (XANAX) 0.25 MG tablet Take 1 tablet (0.25 mg total) by mouth 2 (two) times daily as needed for anxiety. 30 tablet 0  . Black Cohosh 175 MG CAPS Take 1 capsule by mouth daily. menopause    . carbamazepine (TEGRETOL) 200 MG tablet One half tablet twice daily for 2 weeks, then take 1 tablet twice daily 60 tablet 3  . Coenzyme Q10 (COQ-10) 200 MG CAPS Take 1 capsule by mouth every morning.    Marland Kitchen ESTRACE VAGINAL 0.1 MG/GM vaginal cream     . GAVILYTE-G 236 G solution     . lidocaine (XYLOCAINE) 5 % ointment Use gloves to apply 2-3 times daily to thigh as needed. 50 g 2  . meloxicam (MOBIC) 15 MG tablet Take 1 tablet (15 mg total) by mouth daily. As needed for pain 90 tablet 1  . mirtazapine (REMERON) 15 MG tablet Take 1 tablet (15 mg total) by mouth at bedtime. 30 tablet 0  . Multiple Vitamins-Minerals (EYE VITAMINS PO) Take by mouth daily.    . sertraline (ZOLOFT) 50 MG tablet Take 1 tablet (50 mg total) by mouth daily. 90 tablet 3  . SOOLANTRA 1 % CREA     . VITAMIN D, CHOLECALCIFEROL, PO Take by mouth daily.    Marland Kitchen XYZAL 5 MG tablet 1 daily at bedtime for allergy as directed 30 tablet 11   No current facility-administered medications on file prior to visit.

## 2015-03-27 ENCOUNTER — Ambulatory Visit: Payer: BLUE CROSS/BLUE SHIELD | Admitting: General Surgery

## 2015-03-28 ENCOUNTER — Ambulatory Visit (INDEPENDENT_AMBULATORY_CARE_PROVIDER_SITE_OTHER): Payer: BLUE CROSS/BLUE SHIELD

## 2015-03-28 DIAGNOSIS — J309 Allergic rhinitis, unspecified: Secondary | ICD-10-CM

## 2015-03-29 ENCOUNTER — Telehealth: Payer: Self-pay | Admitting: Internal Medicine

## 2015-03-29 MED ORDER — PREDNISONE 10 MG PO TABS: ORAL_TABLET | ORAL | Status: DC

## 2015-03-29 NOTE — Telephone Encounter (Signed)
This may be a form of asthma Offer- prednisone 10 mg, # 20, 4 X 2 DAYS, 3 X 2 DAYS, 2 X 2 DAYS, 1 X 2 DAYS

## 2015-03-29 NOTE — Telephone Encounter (Signed)
Spoke with pt, c/o sob with exertion, wheezing, a "rash" around her neck Xseveral weeks, itching, prod cough with clear mucus.  Denies fever. Pt has been taking allegra qam and chlortrimeton qhs which does not help with her symptoms.  Is requesting further recs.  Pt had her last allergy injection yesterday.    Last ov: 10/30/14 Next ov: none  CY please advise.  Thanks!  Allergies  Allergen Reactions  . Lidocaine Palpitations   Current Outpatient Prescriptions on File Prior to Visit  Medication Sig Dispense Refill  . albuterol (PROVENTIL HFA;VENTOLIN HFA) 108 (90 BASE) MCG/ACT inhaler Inhale 2 puffs into the lungs every 6 (six) hours as needed for wheezing or shortness of breath. 1 Inhaler 11  . ALPRAZolam (XANAX) 0.25 MG tablet Take 1 tablet (0.25 mg total) by mouth 2 (two) times daily as needed for anxiety. 30 tablet 0  . Black Cohosh 175 MG CAPS Take 1 capsule by mouth daily. menopause    . carbamazepine (TEGRETOL) 200 MG tablet One half tablet twice daily for 2 weeks, then take 1 tablet twice daily 60 tablet 3  . Coenzyme Q10 (COQ-10) 200 MG CAPS Take 1 capsule by mouth every morning.    Marland Kitchen ESTRACE VAGINAL 0.1 MG/GM vaginal cream     . GAVILYTE-G 236 G solution     . lidocaine (XYLOCAINE) 5 % ointment Use gloves to apply 2-3 times daily to thigh as needed. 50 g 2  . meloxicam (MOBIC) 15 MG tablet Take 1 tablet (15 mg total) by mouth daily. As needed for pain 90 tablet 1  . mirtazapine (REMERON) 15 MG tablet Take 1 tablet (15 mg total) by mouth at bedtime. 30 tablet 0  . Multiple Vitamins-Minerals (EYE VITAMINS PO) Take by mouth daily.    . sertraline (ZOLOFT) 50 MG tablet Take 1 tablet (50 mg total) by mouth daily. 90 tablet 3  . SOOLANTRA 1 % CREA     . VITAMIN D, CHOLECALCIFEROL, PO Take by mouth daily.    Marland Kitchen XYZAL 5 MG tablet 1 daily at bedtime for allergy as directed 30 tablet 11   No current facility-administered medications on file prior to visit.

## 2015-03-29 NOTE — Telephone Encounter (Signed)
Spoke with pt, she is aware of recs.  Med sent to [facility].  Nothing further needed.

## 2015-04-02 ENCOUNTER — Ambulatory Visit: Payer: BLUE CROSS/BLUE SHIELD

## 2015-04-02 ENCOUNTER — Ambulatory Visit (INDEPENDENT_AMBULATORY_CARE_PROVIDER_SITE_OTHER): Payer: BLUE CROSS/BLUE SHIELD | Admitting: General Surgery

## 2015-04-02 ENCOUNTER — Encounter: Payer: Self-pay | Admitting: General Surgery

## 2015-04-02 ENCOUNTER — Encounter: Payer: Self-pay | Admitting: *Deleted

## 2015-04-02 VITALS — BP 130/72 | HR 74 | Resp 12 | Ht 62.0 in | Wt 132.0 lb

## 2015-04-02 DIAGNOSIS — R928 Other abnormal and inconclusive findings on diagnostic imaging of breast: Secondary | ICD-10-CM

## 2015-04-02 NOTE — Progress Notes (Signed)
Patient ID: Sierra English, female   DOB: 1956/12/03, 59 y.o.   MRN: [id number]  Chief Complaint  Patient presents with  . Follow-up    left breast ultrasound    HPI Sierra English is a 59 y.o. female here today from a left breast ultrasound. On 12/05/14 she had a mammogram and ultrasound showing a 30mm benign appearing lymph node on left breast  3 o'clock. HPI  Past Medical History  Diagnosis Date  . ALLERGIC RHINITIS 08/29/2010  . ANXIETY 08/29/2010  . [place], [place] 08/29/2010  . CHEST PAIN 01/29/2011  . COLONIC POLYPS, HX OF 08/29/2010  . HYPERTENSION 08/29/2010  . Low back pain 05/27/2011  . MACULAR DEGENERATION 08/29/2010  . PARESTHESIA 01/29/2011  . THORACIC/LUMBOSACRAL NEURITIS/RADICULITIS UNSPEC 02/28/2011  . Hyperlipidemia     Past Surgical History  Procedure Laterality Date  . Cholecystectomy      Family History  Problem Relation Age of Onset  . Arthritis Mother   . Hypertension Father   . Lung cancer Father   . Breast cancer Sister 35  . Heart disease Sister     Social History History  Substance Use Topics  . Smoking status: Never Smoker   . Smokeless tobacco: Never Used  . Alcohol Use: No    Allergies  Allergen Reactions  . Lidocaine Palpitations    Current Outpatient Prescriptions  Medication Sig Dispense Refill  . albuterol (PROVENTIL HFA;VENTOLIN HFA) 108 (90 BASE) MCG/ACT inhaler Inhale 2 puffs into the lungs every 6 (six) hours as needed for wheezing or shortness of breath. 1 Inhaler 11  . ALPRAZolam (XANAX) 0.25 MG tablet Take 1 tablet (0.25 mg total) by mouth 2 (two) times daily as needed for anxiety. 30 tablet 0  . Black Cohosh 175 MG CAPS Take 1 capsule by mouth daily. menopause    . carbamazepine (TEGRETOL) 200 MG tablet One half tablet twice daily for 2 weeks, then take 1 tablet twice daily 60 tablet 3  . Coenzyme Q10 (COQ-10) 200 MG CAPS Take 1 capsule by mouth every morning.    Marland Kitchen ESTRACE VAGINAL 0.1 MG/GM vaginal cream     . GAVILYTE-G 236  G solution     . lidocaine (XYLOCAINE) 5 % ointment Use gloves to apply 2-3 times daily to thigh as needed. 50 g 2  . meloxicam (MOBIC) 15 MG tablet Take 1 tablet (15 mg total) by mouth daily. As needed for pain 90 tablet 1  . mirtazapine (REMERON) 15 MG tablet Take 1 tablet (15 mg total) by mouth at bedtime. 30 tablet 0  . Multiple Vitamins-Minerals (EYE VITAMINS PO) Take by mouth daily.    . predniSONE (DELTASONE) 10 MG tablet 4 tabs X2 days, then 3 taps X2 days, then 2 tabs X2 days, then 1 tab X2 days. 20 tablet 0  . sertraline (ZOLOFT) 50 MG tablet Take 1 tablet (50 mg total) by mouth daily. 90 tablet 3  . SOOLANTRA 1 % CREA     . VITAMIN D, CHOLECALCIFEROL, PO Take by mouth daily.    Marland Kitchen XYZAL 5 MG tablet 1 daily at bedtime for allergy as directed 30 tablet 11   No current facility-administered medications for this visit.    Review of Systems Review of Systems  Constitutional: Negative.   Respiratory: Negative.   Cardiovascular: Negative.     Blood pressure 130/72, pulse 74, resp. rate 12, height 5\' 2"  (1.575 m), weight 132 lb (59.875 kg).  Physical Exam Physical Exam  Constitutional: She appears well-developed and well-nourished.  Neck: Neck supple.  Pulmonary/Chest: Right breast exhibits no inverted nipple, no mass, no nipple discharge, no skin change and no tenderness. Left breast exhibits no inverted nipple, no mass, no nipple discharge and no tenderness. Breasts are symmetrical.  Lymphadenopathy:    She has no cervical adenopathy.    She has no axillary adenopathy.    Data Reviewed Mammogram and ultrasound from 12/05/14 reviewed. Left breast US lateral aspect in 3 ocl region today showed no abnormal findings.  Assessment    Left breast mass noted on prior imaging not seen today by [place].     Plan    Patient to return in June or July 2016 with a unilateral left breast diagnostic mammogram.        Christene Lye 04/02/2015, 4:44 PM

## 2015-04-02 NOTE — Patient Instructions (Addendum)
Patient to return in June or July 2016 after mammogram.

## 2015-04-04 ENCOUNTER — Ambulatory Visit (INDEPENDENT_AMBULATORY_CARE_PROVIDER_SITE_OTHER): Payer: BLUE CROSS/BLUE SHIELD

## 2015-04-04 DIAGNOSIS — J309 Allergic rhinitis, unspecified: Secondary | ICD-10-CM

## 2015-04-11 ENCOUNTER — Ambulatory Visit (INDEPENDENT_AMBULATORY_CARE_PROVIDER_SITE_OTHER): Payer: BLUE CROSS/BLUE SHIELD

## 2015-04-11 DIAGNOSIS — J309 Allergic rhinitis, unspecified: Secondary | ICD-10-CM

## 2015-04-17 ENCOUNTER — Encounter: Payer: BLUE CROSS/BLUE SHIELD | Admitting: Family

## 2015-04-18 ENCOUNTER — Ambulatory Visit: Payer: BLUE CROSS/BLUE SHIELD | Admitting: General Surgery

## 2015-04-20 ENCOUNTER — Ambulatory Visit: Payer: BLUE CROSS/BLUE SHIELD | Admitting: Cardiology

## 2015-04-20 ENCOUNTER — Encounter: Payer: Self-pay | Admitting: Internal Medicine

## 2015-04-25 ENCOUNTER — Ambulatory Visit: Payer: BLUE CROSS/BLUE SHIELD

## 2015-04-26 ENCOUNTER — Ambulatory Visit (INDEPENDENT_AMBULATORY_CARE_PROVIDER_SITE_OTHER): Payer: BLUE CROSS/BLUE SHIELD

## 2015-04-26 DIAGNOSIS — J309 Allergic rhinitis, unspecified: Secondary | ICD-10-CM

## 2015-04-27 ENCOUNTER — Ambulatory Visit (INDEPENDENT_AMBULATORY_CARE_PROVIDER_SITE_OTHER): Payer: BLUE CROSS/BLUE SHIELD

## 2015-04-27 DIAGNOSIS — J309 Allergic rhinitis, unspecified: Secondary | ICD-10-CM

## 2015-05-02 ENCOUNTER — Encounter: Payer: Self-pay | Admitting: Cardiology

## 2015-05-03 ENCOUNTER — Ambulatory Visit (INDEPENDENT_AMBULATORY_CARE_PROVIDER_SITE_OTHER): Payer: BLUE CROSS/BLUE SHIELD

## 2015-05-03 DIAGNOSIS — J309 Allergic rhinitis, unspecified: Secondary | ICD-10-CM | POA: Diagnosis not present

## 2015-05-08 ENCOUNTER — Ambulatory Visit (INDEPENDENT_AMBULATORY_CARE_PROVIDER_SITE_OTHER): Payer: BLUE CROSS/BLUE SHIELD

## 2015-05-08 DIAGNOSIS — J309 Allergic rhinitis, unspecified: Secondary | ICD-10-CM | POA: Diagnosis not present

## 2015-05-15 ENCOUNTER — Ambulatory Visit (INDEPENDENT_AMBULATORY_CARE_PROVIDER_SITE_OTHER): Payer: BLUE CROSS/BLUE SHIELD

## 2015-05-15 DIAGNOSIS — J309 Allergic rhinitis, unspecified: Secondary | ICD-10-CM | POA: Diagnosis not present

## 2015-05-21 ENCOUNTER — Ambulatory Visit: Payer: BLUE CROSS/BLUE SHIELD | Admitting: Internal Medicine

## 2015-05-22 ENCOUNTER — Ambulatory Visit: Payer: BLUE CROSS/BLUE SHIELD

## 2015-05-23 ENCOUNTER — Ambulatory Visit (INDEPENDENT_AMBULATORY_CARE_PROVIDER_SITE_OTHER): Payer: BLUE CROSS/BLUE SHIELD | Admitting: Internal Medicine

## 2015-05-23 ENCOUNTER — Encounter: Payer: Self-pay | Admitting: Internal Medicine

## 2015-05-23 ENCOUNTER — Ambulatory Visit: Payer: BLUE CROSS/BLUE SHIELD

## 2015-05-23 VITALS — BP 128/78 | HR 78 | Temp 98.4°F | Resp 15 | Ht 62.0 in | Wt 131.2 lb

## 2015-05-23 DIAGNOSIS — J309 Allergic rhinitis, unspecified: Secondary | ICD-10-CM | POA: Diagnosis not present

## 2015-05-23 DIAGNOSIS — J209 Acute bronchitis, unspecified: Secondary | ICD-10-CM

## 2015-05-23 MED ORDER — HYDROCODONE-HOMATROPINE 5-1.5 MG/5ML PO SYRP: 5.0000 mL | ORAL_SOLUTION | Freq: Four times a day (QID) | ORAL | Status: DC | PRN

## 2015-05-23 MED ORDER — AZITHROMYCIN 250 MG PO TABS: ORAL_TABLET | ORAL | Status: DC

## 2015-05-23 NOTE — Progress Notes (Signed)
   Subjective:    Patient ID: Sierra English, female    DOB: 1956/02/03, 59 y.o.   MRN: [id number]  HPI   She's had a sore throat for 3 days associated with cough productive of yellow sputum. The cough is waking her up at night. She has had some sneezing and itchy, watery eyes. She's used herbal supplement with moderate relief.  She denies any upper respiratory tract infection symptoms.  Review of Systems Frontal headache, facial pain , nasal purulence, dental pain, sore throat , otic pain or otic discharge denied. No fever , chills or sweats.     Objective:   Physical Exam  General appearance:Adequately nourished; no acute distress or increased work of breathing is present.    Lymphatic: No  lymphadenopathy about the head, neck, or axilla .  Eyes: No conjunctival inflammation or lid edema is present. There is no scleral icterus.  Ears:  External ear exam shows no significant lesions or deformities.  Otoscopic examination reveals clear canals, tympanic membranes are intact bilaterally without bulging, retraction, inflammation or discharge.  Nose:  External nasal examination shows no deformity or inflammation. There is inflammation of the external nares as well as the entire nasal mucosa.  Oral exam: Dental hygiene is good; lips and gums are healthy appearing.There is oropharyngeal erythema w/o exudate .  Neck:  No deformities, thyromegaly, masses, or tenderness noted.   Supple with full range of motion without pain.   Heart:  Normal rate and regular rhythm. S1 and S2 normal without gallop, murmur, click, rub or other extra sounds.   Lungs:Chest clear to auscultation; no wheezes, rhonchi,rales ,or rubs present.  Extremities:  No cyanosis, edema, or clubbing  noted    Skin: Warm & dry w/o tenting . No significant lesions or rash.       Assessment & Plan:   #1 acute bronchitis  #2 severe nasal inflammation without rhinosinusitis or strep throat criteria  Plan: See  orders/ recommendations

## 2015-05-23 NOTE — Patient Instructions (Signed)

## 2015-05-23 NOTE — Progress Notes (Signed)
Pre visit review using our clinic review tool, if applicable. No additional management support is needed unless otherwise documented below in the visit note. 

## 2015-05-24 ENCOUNTER — Emergency Department (HOSPITAL_COMMUNITY)
Admission: EM | Admit: 2015-05-24 | Discharge: 2015-05-24 | Disposition: A | Discharge status: Home / Self Care | Payer: BLUE CROSS/BLUE SHIELD | Attending: Emergency Medicine | Admitting: Emergency Medicine

## 2015-05-24 ENCOUNTER — Other Ambulatory Visit: Payer: Self-pay

## 2015-05-24 ENCOUNTER — Telehealth: Payer: Self-pay | Admitting: Family

## 2015-05-24 ENCOUNTER — Encounter (HOSPITAL_COMMUNITY): Payer: Self-pay

## 2015-05-24 DIAGNOSIS — R42 Dizziness and giddiness: Secondary | ICD-10-CM | POA: Diagnosis not present

## 2015-05-24 DIAGNOSIS — R109 Unspecified abdominal pain: Secondary | ICD-10-CM | POA: Insufficient documentation

## 2015-05-24 DIAGNOSIS — R110 Nausea: Secondary | ICD-10-CM | POA: Insufficient documentation

## 2015-05-24 DIAGNOSIS — Z79899 Other long term (current) drug therapy: Secondary | ICD-10-CM | POA: Insufficient documentation

## 2015-05-24 DIAGNOSIS — R531 Weakness: Secondary | ICD-10-CM | POA: Diagnosis not present

## 2015-05-24 DIAGNOSIS — T3695XA Adverse effect of unspecified systemic antibiotic, initial encounter: Secondary | ICD-10-CM | POA: Diagnosis not present

## 2015-05-24 DIAGNOSIS — T50905A Adverse effect of unspecified drugs, medicaments and biological substances, initial encounter: Secondary | ICD-10-CM

## 2015-05-24 DIAGNOSIS — R0981 Nasal congestion: Secondary | ICD-10-CM | POA: Diagnosis not present

## 2015-05-24 DIAGNOSIS — R05 Cough: Secondary | ICD-10-CM | POA: Insufficient documentation

## 2015-05-24 LAB — COMPREHENSIVE METABOLIC PANEL
ALT: 20 U/L (ref 14–54)
AST: 21 U/L (ref 15–41)
Albumin: 3.8 g/dL (ref 3.5–5.0)
Alkaline Phosphatase: 85 U/L (ref 38–126)
Anion gap: 8 (ref 5–15)
BUN: 11 mg/dL (ref 6–20)
CALCIUM: 9.2 mg/dL (ref 8.9–10.3)
CO2: 27 mmol/L (ref 22–32)
Chloride: 102 mmol/L (ref 101–111)
Creatinine, Ser: 0.65 mg/dL (ref 0.44–1.00)
GFR calc Af Amer: >60 mL/min (ref >60)
GLUCOSE: 155 mg/dL — AB (ref 65–99)
POTASSIUM: 4 mmol/L (ref 3.5–5.1)
SODIUM: 137 mmol/L (ref 135–145)
TOTAL PROTEIN: 6.8 g/dL (ref 6.5–8.1)
Total Bilirubin: 1.3 mg/dL — ABNORMAL HIGH (ref 0.3–1.2)

## 2015-05-24 LAB — LIPASE, BLOOD: LIPASE: 56 U/L — AB (ref 22–51)

## 2015-05-24 LAB — CBC WITH DIFFERENTIAL/PLATELET
Basophils Absolute: 0.1 10*3/uL (ref 0.0–0.1)
Basophils Relative: 1 % (ref 0–1)
EOS PCT: 7 % — AB (ref 0–5)
Eosinophils Absolute: 0.6 10*3/uL (ref 0.0–0.7)
HCT: 38.8 % (ref 36.0–46.0)
HEMOGLOBIN: 13 g/dL (ref 12.0–15.0)
LYMPHS PCT: 36 % (ref 12–46)
Lymphs Abs: 3.1 10*3/uL (ref 0.7–4.0)
MCH: 28.7 pg (ref 26.0–34.0)
MCHC: 33.5 g/dL (ref 30.0–36.0)
MCV: 85.7 fL (ref 78.0–100.0)
MONOS PCT: 8 % (ref 3–12)
Monocytes Absolute: 0.7 10*3/uL (ref 0.1–1.0)
NEUTROS ABS: 4.1 10*3/uL (ref 1.7–7.7)
Neutrophils Relative %: 48 % (ref 43–77)
PLATELETS: 217 10*3/uL (ref 150–400)
RBC: 4.53 MIL/uL (ref 3.87–5.11)
RDW: 12.8 % (ref 11.5–15.5)
WBC: 8.5 10*3/uL (ref 4.0–10.5)

## 2015-05-24 LAB — URINALYSIS, ROUTINE W REFLEX MICROSCOPIC
BILIRUBIN URINE: NEGATIVE
GLUCOSE, UA: NEGATIVE mg/dL
HGB URINE DIPSTICK: NEGATIVE
KETONES UR: NEGATIVE mg/dL
Leukocytes, UA: NEGATIVE
Nitrite: NEGATIVE
PROTEIN: NEGATIVE mg/dL
SPECIFIC GRAVITY, URINE: 1.015 (ref 1.005–1.030)
Urobilinogen, UA: 0.2 mg/dL (ref 0.0–1.0)
pH: 5 (ref 5.0–8.0)

## 2015-05-24 LAB — I-STAT TROPONIN, ED: Troponin i, poc: 0 ng/mL (ref 0.00–0.08)

## 2015-05-24 MED ORDER — ONDANSETRON HCL 4 MG/2ML IJ SOLN
4.0000 mg | Freq: Once | INTRAMUSCULAR | Status: AC
  Administered 2015-05-24: 4 mg via INTRAVENOUS
  Filled 2015-05-24: qty 2

## 2015-05-24 MED ORDER — ONDANSETRON HCL 4 MG PO TABS: 4.0000 mg | ORAL_TABLET | Freq: Four times a day (QID) | ORAL | Status: DC

## 2015-05-24 MED ORDER — SODIUM CHLORIDE 0.9 % IV BOLUS (SEPSIS)
1000.0000 mL | Freq: Once | INTRAVENOUS | Status: AC
  Administered 2015-05-24: 1000 mL via INTRAVENOUS

## 2015-05-24 MED ORDER — BENZONATATE 100 MG PO CAPS: 100.0000 mg | ORAL_CAPSULE | Freq: Three times a day (TID) | ORAL | Status: DC

## 2015-05-24 MED ORDER — AMOXICILLIN 500 MG PO CAPS: 500.0000 mg | ORAL_CAPSULE | Freq: Three times a day (TID) | ORAL | Status: DC

## 2015-05-24 NOTE — Telephone Encounter (Signed)
Advised patient of dr hoppers note, had patient repeat back for understanding

## 2015-05-24 NOTE — Discharge Instructions (Signed)
IT IS RECOMMENDED THAT YOU STOP TAKING THE HYDROCODONE COUGH SYRUP. TAKE TESSALON AS PRESCRIBED. CALL YOUR DOCTOR LATER THIS MORNING FOR INSTRUCTIONS ON WHEN TO FOLLOW UP IN OFFICE. TAKE ZOFRAN FOR ANY FURTHER NAUSEA.

## 2015-05-24 NOTE — ED Notes (Signed)
Bed: [facility] Expected date:  Expected time:  Means of arrival:  Comments: EMS 59 yo female diagnosed today with bronchitis, on antibiotics and hydrocodone cough med, now complaining of nausea

## 2015-05-24 NOTE — Telephone Encounter (Signed)
Stop both Amox 500 mg tid #21

## 2015-05-24 NOTE — ED Notes (Signed)
Patient arrives by EMS for complaints of nausea and dizziness after starting an antibiotic and cough med today for a diagnosis of bronchitis.

## 2015-05-24 NOTE — Telephone Encounter (Signed)
Pt called in said that she went to Er last night after taking meds.  She as vomiting and bp went really high and past out.  She has discounted meds but needs to know what she should do?     Best number [phone]

## 2015-05-24 NOTE — ED Provider Notes (Signed)
CSN: [id number]     Arrival date & time 05/24/15  0155 History   First MD Initiated Contact with Patient 05/24/15 [phone]     Chief Complaint  Patient presents with  . Nausea     (Consider location/radiation/quality/duration/timing/severity/associated sxs/prior Treatment) HPI Comments: She was treated by her doctor yesterday for cough with Zithromax Z-Pack and hydrocodone cough syrup. She started taking the hydrocodone cough syrup at midnight and woke around 1:30 with epigastric pain and nausea. She got up and became lightheaded without syncope. Now she has abdominal pain only with coughing and persistent nausea.   The history is provided by the patient and the spouse. No language interpreter was used.    Past Medical History  Diagnosis Date  . ALLERGIC RHINITIS 08/29/2010  . ANXIETY 08/29/2010  . [place], [place] 08/29/2010  . CHEST PAIN 01/29/2011  . COLONIC POLYPS, HX OF 08/29/2010  . HYPERTENSION 08/29/2010  . Low back pain 05/27/2011  . MACULAR DEGENERATION 08/29/2010  . PARESTHESIA 01/29/2011  . THORACIC/LUMBOSACRAL NEURITIS/RADICULITIS UNSPEC 02/28/2011  . Hyperlipidemia    Past Surgical History  Procedure Laterality Date  . Cholecystectomy     Family History  Problem Relation Age of Onset  . Arthritis Mother   . Hypertension Father   . Lung cancer Father   . Breast cancer Sister 39  . Heart disease Sister    History  Substance Use Topics  . Smoking status: Never Smoker   . Smokeless tobacco: Never Used  . Alcohol Use: No   OB History    No data available     Review of Systems  Constitutional: Negative for fever and chills.  HENT: Positive for congestion.   Respiratory: Positive for cough.   Cardiovascular: Negative.   Gastrointestinal: Positive for nausea and abdominal pain. Negative for vomiting.  Musculoskeletal: Negative.   Skin: Negative.   Neurological: Positive for weakness and light-headedness. Negative for syncope.      Allergies  Lidocaine  Home  Medications   Prior to Admission medications   Medication Sig Start Date End Date Taking? Authorizing Provider  azithromycin (ZITHROMAX Z-PAK) 250 MG tablet 2 day 1, then 1 qd 05/23/15  Yes Hendricks Limes, MD  Black Cohosh 175 MG CAPS Take 1 capsule by mouth daily. menopause   Yes Historical Provider, MD  Coenzyme Q10 (COQ-10) 200 MG CAPS Take 1 capsule by mouth every morning.   Yes Historical Provider, MD  ESTRACE VAGINAL 0.1 MG/GM vaginal cream Place 1 Applicatorful vaginally once a week.  05/08/14  Yes Historical Provider, MD  HYDROcodone-homatropine (HYDROMET) 5-1.5 MG/5ML syrup Take 5 mLs by mouth every 6 (six) hours as needed for cough. 05/23/15  Yes Hendricks Limes, MD  Multiple Vitamins-Minerals (EYE VITAMINS PO) Take by mouth daily.   Yes Historical Provider, MD  VITAMIN D, CHOLECALCIFEROL, PO Take by mouth daily.   Yes Historical Provider, MD  albuterol (PROVENTIL HFA;VENTOLIN HFA) 108 (90 BASE) MCG/ACT inhaler Inhale 2 puffs into the lungs every 6 (six) hours as needed for wheezing or shortness of breath. Patient not taking: Reported on 05/24/2015 06/20/14   Deneise Lever, MD  ALPRAZolam Duanne Moron) 0.25 MG tablet Take 1 tablet (0.25 mg total) by mouth 2 (two) times daily as needed for anxiety. Patient not taking: Reported on 05/24/2015 11/15/14   Golden Circle, FNP  carbamazepine (TEGRETOL) 200 MG tablet One half tablet twice daily for 2 weeks, then take 1 tablet twice daily Patient not taking: Reported on 05/24/2015 12/06/14   Elon Alas  Jannifer Franklin, MD  lidocaine (XYLOCAINE) 5 % ointment Use gloves to apply 2-3 times daily to thigh as needed. Patient not taking: Reported on 05/24/2015 11/20/14   Alda Berthold, DO  meloxicam (MOBIC) 15 MG tablet Take 1 tablet (15 mg total) by mouth daily. As needed for pain Patient not taking: Reported on 05/24/2015 08/01/14   Biagio Borg, MD  mirtazapine (REMERON) 15 MG tablet Take 1 tablet (15 mg total) by mouth at bedtime. Patient not taking: Reported on  05/24/2015 11/15/14   Golden Circle, FNP  predniSONE (DELTASONE) 10 MG tablet 4 tabs X2 days, then 3 taps X2 days, then 2 tabs X2 days, then 1 tab X2 days. Patient not taking: Reported on 05/24/2015 03/29/15   Deneise Lever, MD  sertraline (ZOLOFT) 50 MG tablet Take 1 tablet (50 mg total) by mouth daily. Patient not taking: Reported on 05/24/2015 05/31/14   Biagio Borg, MD  XYZAL 5 MG tablet 1 daily at bedtime for allergy as directed Patient not taking: Reported on 05/24/2015 06/20/14   Deneise Lever, MD   BP 124/64 mmHg  Pulse 67  Temp(Src) 97.5 F (36.4 C) (Oral)  Resp 18  SpO2 94% Physical Exam  Constitutional: She is oriented to person, place, and time. She appears well-developed and well-nourished.  HENT:  Head: Normocephalic.  Neck: Normal range of motion. Neck supple.  Cardiovascular: Normal rate and regular rhythm.   Pulmonary/Chest: Effort normal and breath sounds normal.  Actively coughing.  Abdominal: Soft. Bowel sounds are normal. There is no tenderness. There is no rebound and no guarding.  Musculoskeletal: Normal range of motion.  Neurological: She is alert and oriented to person, place, and time.  Skin: Skin is warm and dry. No rash noted.  Psychiatric: She has a normal mood and affect.    ED Course  Procedures (including critical care time) Labs Review Labs Reviewed  CBC WITH DIFFERENTIAL/PLATELET - Abnormal; Notable for the following:    Eosinophils Relative 7 (*)    All other components within normal limits  COMPREHENSIVE METABOLIC PANEL - Abnormal; Notable for the following:    Glucose, Bld 155 (*)    Total Bilirubin 1.3 (*)    All other components within normal limits  LIPASE, BLOOD - Abnormal; Notable for the following:    Lipase 56 (*)    All other components within normal limits  URINALYSIS, ROUTINE W REFLEX MICROSCOPIC (NOT AT [facility]) - Abnormal; Notable for the following:    APPearance CLOUDY (*)    All other components within normal limits  I-STAT  TROPOININ, ED    Imaging Review No results found.   EKG Interpretation None      MDM   Final diagnoses:  None    1. Nausea 2. Adverse drug reaction  She feels better with IV fluids and zofran. No further nausea. Tolerating PO fluids. Feel that constellation of symptoms - nausea, lightheadedness/woosiness - are likely a side effect of hydrocodone and recommended she stop taking it. She will consult her doctor in the morning regarding her concern for continuing the Z-Pack.     Charlann Lange, PA-C 05/24/15 2542  Varney Biles, MD 05/25/15 [phone]

## 2015-05-24 NOTE — ED Notes (Signed)
Pt presents for fall at home, unsure of LOC, c/o of nausea, denies vomiting/diarrhea. Also c/o intermittent sharp upper abdominal pain.

## 2015-05-24 NOTE — Telephone Encounter (Signed)
Patient was prescribed zpak and hydromet----she has zofran that was ordered by ER doctor---please advise, thanks

## 2015-05-30 ENCOUNTER — Ambulatory Visit: Payer: BLUE CROSS/BLUE SHIELD

## 2015-06-08 ENCOUNTER — Ambulatory Visit: Payer: BC Managed Care – PPO | Admitting: Nurse Practitioner

## 2015-06-12 ENCOUNTER — Ambulatory Visit (INDEPENDENT_AMBULATORY_CARE_PROVIDER_SITE_OTHER): Payer: BLUE CROSS/BLUE SHIELD | Admitting: Family

## 2015-06-12 ENCOUNTER — Encounter: Payer: Self-pay | Admitting: Family

## 2015-06-12 ENCOUNTER — Encounter: Payer: BLUE CROSS/BLUE SHIELD | Admitting: Family

## 2015-06-12 VITALS — BP 138/82 | HR 71 | Temp 98.1°F | Resp 18 | Ht 62.0 in | Wt 133.4 lb

## 2015-06-12 DIAGNOSIS — Z0000 Encounter for general adult medical examination without abnormal findings: Secondary | ICD-10-CM

## 2015-06-12 DIAGNOSIS — Z23 Encounter for immunization: Secondary | ICD-10-CM | POA: Diagnosis not present

## 2015-06-12 NOTE — Progress Notes (Signed)
Pre visit review using our clinic review tool, if applicable. No additional management support is needed unless otherwise documented below in the visit note. 

## 2015-06-12 NOTE — Patient Instructions (Addendum)
Thank you for choosing [facility].  Summary/Instructions:  Please take your medication as prescribed.   Please stop by the lab on the basement level of the building for your blood work. Your results will be released to [place] (or called to you) after review, usually within 72 hours after test completion. If any changes need to be made, you will be notified at that same time.  Health Maintenance Adopting a healthy lifestyle and getting preventive care can go a long way to promote health and wellness. Talk with your health care provider about what schedule of regular examinations is right for you. This is a good chance for you to check in with your provider about disease prevention and staying healthy. In between checkups, there are plenty of things you can do on your own. Experts have done a lot of research about which lifestyle changes and preventive measures are most likely to keep you healthy. Ask your health care provider for more information. WEIGHT AND DIET  Eat a healthy diet  Be sure to include plenty of vegetables, fruits, low-fat dairy products, and lean protein.  Do not eat a lot of foods high in solid fats, added sugars, or salt.  Get regular exercise. This is one of the most important things you can do for your health.  Most adults should exercise for at least 150 minutes each week. The exercise should increase your heart rate and make you sweat (moderate-intensity exercise).  Most adults should also do strengthening exercises at least twice a week. This is in addition to the moderate-intensity exercise.  Maintain a healthy weight  Body mass index (BMI) is a measurement that can be used to identify possible weight problems. It estimates body fat based on height and weight. Your health care provider can help determine your BMI and help you achieve or maintain a healthy weight.  For females 2 years of age and older:   A BMI below 18.5 is considered underweight.  A  BMI of 18.5 to 24.9 is normal.  A BMI of 25 to 29.9 is considered overweight.  A BMI of 30 and above is considered obese.  Watch levels of cholesterol and blood lipids  You should start having your blood tested for lipids and cholesterol at 60 years of age, then have this test every 5 years.  You may need to have your cholesterol levels checked more often if:  Your lipid or cholesterol levels are high.  You are older than 60 years of age.  You are at high risk for heart disease.  CANCER SCREENING   Lung Cancer  Lung cancer screening is recommended for adults 61-4 years old who are at high risk for lung cancer because of a history of smoking.  A yearly low-dose CT scan of the lungs is recommended for people who:  Currently smoke.  Have quit within the past 15 years.  Have at least a 30-pack-year history of smoking. A pack year is smoking an average of one pack of cigarettes a day for 1 year.  Yearly screening should continue until it has been 15 years since you quit.  Yearly screening should stop if you develop a health problem that would prevent you from having lung cancer treatment.  Breast Cancer  Practice breast self-awareness. This means understanding how your breasts normally appear and feel.  It also means doing regular breast self-exams. Let your health care provider know about any changes, no matter how small.  If you are in your 68s  or 78s, you should have a clinical breast exam (CBE) by a health care provider every 1-3 years as part of a regular health exam.  If you are 37 or older, have a CBE every year. Also consider having a breast X-ray (mammogram) every year.  If you have a family history of breast cancer, talk to your health care provider about genetic screening.  If you are at high risk for breast cancer, talk to your health care provider about having an MRI and a mammogram every year.  Breast cancer gene (BRCA) assessment is recommended for women  who have family members with BRCA-related cancers. BRCA-related cancers include:  Breast.  Ovarian.  Tubal.  Peritoneal cancers.  Results of the assessment will determine the need for genetic counseling and BRCA1 and BRCA2 testing. Cervical Cancer Routine pelvic examinations to screen for cervical cancer are no longer recommended for nonpregnant women who are considered low risk for cancer of the pelvic organs (ovaries, uterus, and vagina) and who do not have symptoms. A pelvic examination may be necessary if you have symptoms including those associated with pelvic infections. Ask your health care provider if a screening pelvic exam is right for you.   The Pap test is the screening test for cervical cancer for women who are considered at risk.  If you had a hysterectomy for a problem that was not cancer or a condition that could lead to cancer, then you no longer need Pap tests.  If you are older than 65 years, and you have had normal Pap tests for the past 10 years, you no longer need to have Pap tests.  If you have had past treatment for cervical cancer or a condition that could lead to cancer, you need Pap tests and screening for cancer for at least 20 years after your treatment.  If you no longer get a Pap test, assess your risk factors if they change (such as having a new sexual partner). This can affect whether you should start being screened again.  Some women have medical problems that increase their chance of getting cervical cancer. If this is the case for you, your health care provider may recommend more frequent screening and Pap tests.  The human papillomavirus (HPV) test is another test that may be used for cervical cancer screening. The HPV test looks for the virus that can cause cell changes in the cervix. The cells collected during the Pap test can be tested for HPV.  The HPV test can be used to screen women 4 years of age and older. Getting tested for HPV can extend the  interval between normal Pap tests from three to five years.  An HPV test also should be used to screen women of any age who have unclear Pap test results.  After 60 years of age, women should have HPV testing as often as Pap tests.  Colorectal Cancer  This type of cancer can be detected and often prevented.  Routine colorectal cancer screening usually begins at 60 years of age and continues through 60 years of age.  Your health care provider may recommend screening at an earlier age if you have risk factors for colon cancer.  Your health care provider may also recommend using home test kits to check for hidden blood in the stool.  A small camera at the end of a tube can be used to examine your colon directly (sigmoidoscopy or colonoscopy). This is done to check for the earliest forms of colorectal cancer.  Routine screening usually begins at age 70.  Direct examination of the colon should be repeated every 5-10 years through 60 years of age. However, you may need to be screened more often if early forms of precancerous polyps or small growths are found. Skin Cancer  Check your skin from head to toe regularly.  Tell your health care provider about any new moles or changes in moles, especially if there is a change in a mole's shape or color.  Also tell your health care provider if you have a mole that is larger than the size of a pencil eraser.  Always use sunscreen. Apply sunscreen liberally and repeatedly throughout the day.  Protect yourself by wearing long sleeves, pants, a wide-brimmed hat, and sunglasses whenever you are outside. HEART DISEASE, DIABETES, AND HIGH BLOOD PRESSURE   Have your blood pressure checked at least every 1-2 years. High blood pressure causes heart disease and increases the risk of stroke.  If you are between 80 years and 2 years old, ask your health care provider if you should take aspirin to prevent strokes.  Have regular diabetes screenings. This  involves taking a blood sample to check your fasting blood sugar level.  If you are at a normal weight and have a low risk for diabetes, have this test once every three years after 60 years of age.  If you are overweight and have a high risk for diabetes, consider being tested at a younger age or more often. PREVENTING INFECTION  Hepatitis B  If you have a higher risk for hepatitis B, you should be screened for this virus. You are considered at high risk for hepatitis B if:  You were born in a country where hepatitis B is common. Ask your health care provider which countries are considered high risk.  Your parents were born in a high-risk country, and you have not been immunized against hepatitis B (hepatitis B vaccine).  You have HIV or AIDS.  You use needles to inject street drugs.  You live with someone who has hepatitis B.  You have had sex with someone who has hepatitis B.  You get hemodialysis treatment.  You take certain medicines for conditions, including cancer, organ transplantation, and autoimmune conditions. Hepatitis C  Blood testing is recommended for:  Everyone born from [birth year] through 1966.  Anyone with known risk factors for hepatitis C. Sexually transmitted infections (STIs)  You should be screened for sexually transmitted infections (STIs) including gonorrhea and chlamydia if:  You are sexually active and are younger than 60 years of age.  You are older than 60 years of age and your health care provider tells you that you are at risk for this type of infection.  Your sexual activity has changed since you were last screened and you are at an increased risk for chlamydia or gonorrhea. Ask your health care provider if you are at risk.  If you do not have HIV, but are at risk, it may be recommended that you take a prescription medicine daily to prevent HIV infection. This is called pre-exposure prophylaxis (PrEP). You are considered at risk if:  You are  sexually active and do not regularly use condoms or know the HIV status of your partner(s).  You take drugs by injection.  You are sexually active with a partner who has HIV. Talk with your health care provider about whether you are at high risk of being infected with HIV. If you choose to begin PrEP, you should first  be tested for HIV. You should then be tested every 3 months for as long as you are taking PrEP.  PREGNANCY   If you are premenopausal and you may become pregnant, ask your health care provider about preconception counseling.  If you may become pregnant, take 400 to 800 micrograms (mcg) of folic acid every day.  If you want to prevent pregnancy, talk to your health care provider about birth control (contraception). OSTEOPOROSIS AND MENOPAUSE   Osteoporosis is a disease in which the bones lose minerals and strength with aging. This can result in serious bone fractures. Your risk for osteoporosis can be identified using a bone density scan.  If you are 1 years of age or older, or if you are at risk for osteoporosis and fractures, ask your health care provider if you should be screened.  Ask your health care provider whether you should take a calcium or vitamin D supplement to lower your risk for osteoporosis.  Menopause may have certain physical symptoms and risks.  Hormone replacement therapy may reduce some of these symptoms and risks. Talk to your health care provider about whether hormone replacement therapy is right for you.  HOME CARE INSTRUCTIONS   Schedule regular health, dental, and eye exams.  Stay current with your immunizations.   Do not use any tobacco products including cigarettes, chewing tobacco, or electronic cigarettes.  If you are pregnant, do not drink alcohol.  If you are breastfeeding, limit how much and how often you drink alcohol.  Limit alcohol intake to no more than 1 drink per day for nonpregnant women. One drink equals 12 ounces of beer, 5  ounces of wine, or 1 ounces of hard liquor.  Do not use street drugs.  Do not share needles.  Ask your health care provider for help if you need support or information about quitting drugs.  Tell your health care provider if you often feel depressed.  Tell your health care provider if you have ever been abused or do not feel safe at home. Document Released: 06/30/2011 Document Revised: 05/01/2014 Document Reviewed: 11/16/2013 [facility] Patient Information 2015 [place], [place]. This information is not intended to replace advice given to you by your health care provider. Make sure you discuss any questions you have with your health care provider.

## 2015-06-12 NOTE — Progress Notes (Signed)
Subjective:    Patient ID: Sierra English, female    DOB: 1956/06/06, 59 y.o.   MRN: [id number]  Chief Complaint  Patient presents with  . CPE    Not fasting    HPI:  Sierra English is a 59 y.o. female who presents today for an annual wellness visit.   1) Health Maintenance -   Diet - Averages 3 meals per day with fruit and vegetables.   Exercise - Couple of times per week.    2) Preventative Exams / Immunizations:  Dental -- Due for exam   Vision -- Up to date   Health Maintenance  Topic Date Due  . HIV Screening  10/15/1970  . PAP SMEAR  12/15/2013  . INFLUENZA VACCINE  07/30/2015  . MAMMOGRAM  12/05/2016  . TETANUS/TDAP  08/29/2020  . COLONOSCOPY  05/23/2024    Immunization History  Administered Date(s) Administered  . Influenza Split 10/16/2011, 10/19/2012, 09/28/2013  . Influenza Whole 08/29/2010  . Influenza,inj,Quad PF,36+ Mos 11/08/2014  . Td 08/29/2010   Allergies  Allergen Reactions  . Hydrocodone   . Lidocaine Palpitations     Outpatient Prescriptions Prior to Visit  Medication Sig Dispense Refill  . albuterol (PROVENTIL HFA;VENTOLIN HFA) 108 (90 BASE) MCG/ACT inhaler Inhale 2 puffs into the lungs every 6 (six) hours as needed for wheezing or shortness of breath. 1 Inhaler 11  . Black Cohosh 175 MG CAPS Take 1 capsule by mouth daily. menopause    . carbamazepine (TEGRETOL) 200 MG tablet One half tablet twice daily for 2 weeks, then take 1 tablet twice daily 60 tablet 3  . Coenzyme Q10 (COQ-10) 200 MG CAPS Take 1 capsule by mouth every morning.    Marland Kitchen ESTRACE VAGINAL 0.1 MG/GM vaginal cream Place 1 Applicatorful vaginally once a week.     . mirtazapine (REMERON) 15 MG tablet Take 1 tablet (15 mg total) by mouth at bedtime. 30 tablet 0  . Multiple Vitamins-Minerals (EYE VITAMINS PO) Take by mouth daily.    Marland Kitchen VITAMIN D, CHOLECALCIFEROL, PO Take by mouth daily.    Marland Kitchen XYZAL 5 MG tablet 1 daily at bedtime for allergy as directed 30 tablet 11   . ALPRAZolam (XANAX) 0.25 MG tablet Take 1 tablet (0.25 mg total) by mouth 2 (two) times daily as needed for anxiety. 30 tablet 0  . azithromycin (ZITHROMAX Z-PAK) 250 MG tablet 2 day 1, then 1 qd 6 tablet 0  . HYDROcodone-homatropine (HYDROMET) 5-1.5 MG/5ML syrup Take 5 mLs by mouth every 6 (six) hours as needed for cough. 120 mL 0  . lidocaine (XYLOCAINE) 5 % ointment Use gloves to apply 2-3 times daily to thigh as needed. 50 g 2  . meloxicam (MOBIC) 15 MG tablet Take 1 tablet (15 mg total) by mouth daily. As needed for pain 90 tablet 1  . ondansetron (ZOFRAN) 4 MG tablet Take 1 tablet (4 mg total) by mouth every 6 (six) hours. 12 tablet 0  . predniSONE (DELTASONE) 10 MG tablet 4 tabs X2 days, then 3 taps X2 days, then 2 tabs X2 days, then 1 tab X2 days. 20 tablet 0  . sertraline (ZOLOFT) 50 MG tablet Take 1 tablet (50 mg total) by mouth daily. 90 tablet 3  . amoxicillin (AMOXIL) 500 MG capsule Take 1 capsule (500 mg total) by mouth 3 (three) times daily. 21 capsule 0  . benzonatate (TESSALON) 100 MG capsule Take 1 capsule (100 mg total) by mouth every 8 (eight) hours. 21 capsule 0  No facility-administered medications prior to visit.     Past Medical History  Diagnosis Date  . ALLERGIC RHINITIS 08/29/2010  . ANXIETY 08/29/2010  . [place] 08/29/2010  . CHEST PAIN 01/29/2011  . COLONIC POLYPS, HX OF 08/29/2010  . HYPERTENSION 08/29/2010  . Low back pain 05/27/2011  . MACULAR DEGENERATION 08/29/2010  . PARESTHESIA 01/29/2011  . THORACIC/LUMBOSACRAL NEURITIS/RADICULITIS UNSPEC 02/28/2011  . Hyperlipidemia      Past Surgical History  Procedure Laterality Date  . Cholecystectomy       Family History  Problem Relation Age of Onset  . Arthritis Mother   . Hypertension Father   . Lung cancer Father   . Breast cancer Sister 60  . Heart disease Sister      History   Social History  . Marital Status: Married    Spouse Name: N/A  . Number of Children: 2  . Years of Education:  N/A   Occupational History  . hairdresser    Social History Main Topics  . Smoking status: Never Smoker   . Smokeless tobacco: Never Used  . Alcohol Use: No  . Drug Use: No  . Sexual Activity: Not on file   Other Topics Concern  . Not on file   Social History Narrative   Lives with husband .  From [place] originally.    Patient is a Materials engineer.          Review of Systems  Constitutional: Denies fever, chills, fatigue, or significant weight gain/loss. HENT: Head: Denies neck pain Headaches.  Ears: Denies changes in hearing, ringing in ears, earache, drainage Nose: Denies discharge, stuffiness, itching, nosebleed, sinus pain Throat: Denies sore throat, hoarseness, dry mouth, sores, thrush Eyes: Denies loss/changes in vision, pain, redness, blurry/double vision, flashing lights Cardiovascular: Denies chest pain/discomfort, tightness, palpitations, shortness of breath with activity, difficulty lying down, swelling, sudden awakening with shortness of breath Respiratory: Denies shortness of breath, cough, sputum production, wheezing Gastrointestinal: Denies dysphasia, heartburn, change in appetite, nausea, change in bowel habits, rectal bleeding, constipation, diarrhea, yellow skin or eyes Genitourinary: Denies frequency, urgency, burning/pain, blood in urine, incontinence, change in urinary strength. Musculoskeletal: Denies muscle/joint pain, stiffness, back pain, redness or swelling of joints, trauma Skin: Denies rashes, lumps, itching, dryness, color changes, or hair/nail changes Neurological: Denies dizziness, fainting, seizures, weakness, numbness, tingling, tremor Psychiatric - Denies nervousness, stress, depression or memory loss Endocrine: Denies heat or cold intolerance, sweating, frequent urination, excessive thirst, changes in appetite Hematologic: Denies ease of bruising or bleeding     Objective:     BP 138/82 mmHg  Pulse 71  Temp(Src) 98.1 F (36.7 C) (Oral)   Resp 18  Ht 5\' 2"  (1.575 m)  Wt 133 lb 6.4 oz (60.51 kg)  BMI 24.39 kg/m2  SpO2 98% Nursing note and vital signs reviewed.  Physical Exam  Constitutional: She is oriented to person, place, and time. She appears well-developed and well-nourished.  HENT:  Head: Normocephalic.  Right Ear: Hearing, tympanic membrane, external ear and ear canal normal.  Left Ear: Hearing, tympanic membrane, external ear and ear canal normal.  Nose: Nose normal.  Mouth/Throat: Uvula is midline, oropharynx is clear and moist and mucous membranes are normal.  Eyes: Conjunctivae and EOM are normal. Pupils are equal, round, and reactive to light.  Neck: Neck supple. No JVD present. No tracheal deviation present. No thyromegaly present.  Cardiovascular: Normal rate, regular rhythm, normal heart sounds and intact distal pulses.   Pulmonary/Chest: Effort normal and breath sounds normal.  Abdominal: Soft. Bowel sounds are normal. She exhibits no distension and no mass. There is no tenderness. There is no rebound and no guarding.  Musculoskeletal: Normal range of motion. She exhibits no edema or tenderness.  Lymphadenopathy:    She has no cervical adenopathy.  Neurological: She is alert and oriented to person, place, and time. She has normal reflexes. No cranial nerve deficit. She exhibits normal muscle tone. Coordination normal.  Skin: Skin is warm and dry.  Psychiatric: She has a normal mood and affect. Her behavior is normal. Judgment and thought content normal.       Assessment & Plan:   Problem List Items Addressed This Visit      Other   Routine general medical examination at a health care facility - Primary    1) Anticipatory Guidance: Discussed importance of wearing a seatbelt while driving and not texting while driving; changing batteries in smoke detector at least once annually; wearing suntan lotion when outside; eating a balanced and moderate diet; getting physical activity at least 30 minutes per  day.  2) Immunizations / Screenings / Labs:  Zostavax completed today. All other immunizations are up to date per recommendations. All screenings are up to date per recommendations. Obtain CBC, CMET, Lipid profile, A1c, and TSH when fasting.  Overall well exam. Minimal risk factors for cardiovascular disease at this time. Her hypertension is well controlled with no medication. Continue current healthy lifestyle behaviors. Follow up office visit pending lab work and follow up prevention exam in 1 year. Headaches appear to be resolving from a recent fall.        Relevant Orders   Comprehensive metabolic panel   CBC   Lipid panel   TSH   Hemoglobin A1c

## 2015-06-12 NOTE — Assessment & Plan Note (Addendum)
1) Anticipatory Guidance: Discussed importance of wearing a seatbelt while driving and not texting while driving; changing batteries in smoke detector at least once annually; wearing suntan lotion when outside; eating a balanced and moderate diet; getting physical activity at least 30 minutes per day.  2) Immunizations / Screenings / Labs:  Zostavax completed today. All other immunizations are up to date per recommendations. All screenings are up to date per recommendations. Obtain CBC, CMET, Lipid profile, A1c, and TSH when fasting.  Overall well exam. Minimal risk factors for cardiovascular disease at this time. Her hypertension is well controlled with no medication. Continue current healthy lifestyle behaviors. Follow up office visit pending lab work and follow up prevention exam in 1 year. Headaches appear to be resolving from a recent fall.

## 2015-06-13 DIAGNOSIS — Z23 Encounter for immunization: Secondary | ICD-10-CM | POA: Diagnosis not present

## 2015-06-13 NOTE — Addendum Note (Signed)
Addended by: Delice Bison E on: 06/13/2015 08:43 AM   Modules accepted: Orders

## 2015-06-27 ENCOUNTER — Telehealth: Payer: Self-pay | Admitting: Internal Medicine

## 2015-06-27 NOTE — Telephone Encounter (Signed)
I don't think there will be a problem

## 2015-06-27 NOTE — Telephone Encounter (Signed)
i called made pt aware. Nothing further needed 

## 2015-06-27 NOTE — Telephone Encounter (Signed)
Spoke with the pt  She had Zoster vaccine on 06/12/15  She is wondering if this will interfere with allergy vaccine this wk  Dr. Annamaria Boots, please advise thanks!

## 2015-06-29 ENCOUNTER — Ambulatory Visit (INDEPENDENT_AMBULATORY_CARE_PROVIDER_SITE_OTHER): Payer: BLUE CROSS/BLUE SHIELD

## 2015-06-29 DIAGNOSIS — J309 Allergic rhinitis, unspecified: Secondary | ICD-10-CM

## 2015-07-05 ENCOUNTER — Ambulatory Visit (INDEPENDENT_AMBULATORY_CARE_PROVIDER_SITE_OTHER): Payer: BLUE CROSS/BLUE SHIELD

## 2015-07-05 DIAGNOSIS — J309 Allergic rhinitis, unspecified: Secondary | ICD-10-CM | POA: Diagnosis not present

## 2015-07-11 ENCOUNTER — Other Ambulatory Visit (INDEPENDENT_AMBULATORY_CARE_PROVIDER_SITE_OTHER): Payer: BLUE CROSS/BLUE SHIELD

## 2015-07-11 ENCOUNTER — Telehealth: Payer: Self-pay | Admitting: Family

## 2015-07-11 DIAGNOSIS — Z0000 Encounter for general adult medical examination without abnormal findings: Secondary | ICD-10-CM

## 2015-07-11 DIAGNOSIS — R7989 Other specified abnormal findings of blood chemistry: Secondary | ICD-10-CM

## 2015-07-11 LAB — TSH: TSH: 6.29 u[IU]/mL — AB (ref 0.35–4.50)

## 2015-07-11 LAB — COMPREHENSIVE METABOLIC PANEL
ALT: 23 U/L (ref 0–35)
AST: 22 U/L (ref 0–37)
Albumin: 4.2 g/dL (ref 3.5–5.2)
Alkaline Phosphatase: 89 U/L (ref 39–117)
BUN: 9 mg/dL (ref 6–23)
CALCIUM: 9.7 mg/dL (ref 8.4–10.5)
CHLORIDE: 102 meq/L (ref 96–112)
CO2: 29 mEq/L (ref 19–32)
Creatinine, Ser: 0.75 mg/dL (ref 0.40–1.20)
GFR: 83.85 mL/min (ref >60.00)
Glucose, Bld: 119 mg/dL — ABNORMAL HIGH (ref 70–99)
Potassium: 3.7 mEq/L (ref 3.5–5.1)
SODIUM: 139 meq/L (ref 135–145)
Total Bilirubin: 2.5 mg/dL — ABNORMAL HIGH (ref 0.2–1.2)
Total Protein: 7.1 g/dL (ref 6.0–8.3)

## 2015-07-11 LAB — CBC
HCT: 42 % (ref 36.0–46.0)
Hemoglobin: 14.3 g/dL (ref 12.0–15.0)
MCHC: 34 g/dL (ref 30.0–36.0)
MCV: 84.9 fl (ref 78.0–100.0)
PLATELETS: 247 10*3/uL (ref 150.0–400.0)
RBC: 4.95 Mil/uL (ref 3.87–5.11)
RDW: 12.8 % (ref 11.5–15.5)
WBC: 6.8 10*3/uL (ref 4.0–10.5)

## 2015-07-11 LAB — LIPID PANEL
Cholesterol: 220 mg/dL — ABNORMAL HIGH (ref 0–200)
HDL: 44.9 mg/dL (ref >39.00)
LDL Cholesterol: 151 mg/dL — ABNORMAL HIGH (ref 0–99)
NONHDL: 175.1
TRIGLYCERIDES: 121 mg/dL (ref 0.0–149.0)
Total CHOL/HDL Ratio: 5
VLDL: 24.2 mg/dL (ref 0.0–40.0)

## 2015-07-11 LAB — HEMOGLOBIN A1C: Hgb A1c MFr Bld: 6.2 % (ref 4.6–6.5)

## 2015-07-11 NOTE — Telephone Encounter (Signed)
Please inform patient that her blood work shows that her hemoglobin A1c is 6.2. Her kidney function, liver function, white/red blood cells and electrolytes are all within the normal limits. Her cholesterol was elevated with a bad cholesterol, or LDL, 150 with with a goal of less than 100. For this a recommend dietary changes including decreasing her intake of saturated fats and increasing her fiber intake. Lastly, her thyroid function was elevated and I would like her to repeat her thyroid function tests next week at her convenience. This is a nonfasting test and can be done at any time throughout the day. If it's confirmed her thyroid level is high, we may need to start her on medication for hypothyroidism.

## 2015-07-12 ENCOUNTER — Ambulatory Visit (INDEPENDENT_AMBULATORY_CARE_PROVIDER_SITE_OTHER): Payer: BLUE CROSS/BLUE SHIELD

## 2015-07-12 DIAGNOSIS — J309 Allergic rhinitis, unspecified: Secondary | ICD-10-CM

## 2015-07-13 ENCOUNTER — Encounter: Payer: Self-pay | Admitting: Family

## 2015-07-13 ENCOUNTER — Other Ambulatory Visit (INDEPENDENT_AMBULATORY_CARE_PROVIDER_SITE_OTHER): Payer: BLUE CROSS/BLUE SHIELD

## 2015-07-13 ENCOUNTER — Ambulatory Visit (INDEPENDENT_AMBULATORY_CARE_PROVIDER_SITE_OTHER): Payer: BLUE CROSS/BLUE SHIELD | Admitting: Family

## 2015-07-13 VITALS — BP 130/84 | HR 63 | Temp 98.2°F | Resp 18 | Ht 62.0 in | Wt 133.8 lb

## 2015-07-13 DIAGNOSIS — M2550 Pain in unspecified joint: Secondary | ICD-10-CM

## 2015-07-13 DIAGNOSIS — R946 Abnormal results of thyroid function studies: Secondary | ICD-10-CM

## 2015-07-13 DIAGNOSIS — R7989 Other specified abnormal findings of blood chemistry: Secondary | ICD-10-CM

## 2015-07-13 LAB — SEDIMENTATION RATE: Sed Rate: 10 mm/hr (ref 0–22)

## 2015-07-13 LAB — C-REACTIVE PROTEIN: CRP: 0.1 mg/dL — ABNORMAL LOW (ref 0.5–20.0)

## 2015-07-13 LAB — TSH: TSH: 2.42 u[IU]/mL (ref 0.35–4.50)

## 2015-07-13 LAB — T4: T4, Total: 6.7 ug/dL (ref 4.5–12.0)

## 2015-07-13 LAB — RHEUMATOID FACTOR

## 2015-07-13 MED ORDER — NAPROXEN 500 MG PO TABS: 500.0000 mg | ORAL_TABLET | Freq: Two times a day (BID) | ORAL | Status: DC

## 2015-07-13 NOTE — Patient Instructions (Addendum)
Thank you for choosing [facility].  Summary/Instructions:  Your prescription(s) have been submitted to your pharmacy or been printed and provided for you. Please take as directed and contact our office if you believe you are having problem(s) with the medication(s) or have any questions.  Please stop by the lab on the basement level of the building for your blood work. Your results will be released to [place] (or called to you) after review, usually within 72 hours after test completion. If any changes need to be made, you will be notified at that same time.  Please stop by radiology on the basement level of the building for your x-rays. Your results will be released to [place] (or called to you) after review, usually within 72 hours after test completion. If any treatments or changes are necessary, you will be notified at that same time.  Referrals have been made during this visit. You should expect to hear back from our schedulers in about 7-10 days in regards to establishing an appointment with the specialists we discussed.   If your symptoms worsen or fail to improve, please contact our office for further instruction, or in case of emergency go directly to the emergency room at the closest medical facility.    Osteoarthritis Osteoarthritis is a disease that causes soreness and inflammation of a joint. It occurs when the cartilage at the affected joint wears down. Cartilage acts as a cushion, covering the ends of bones where they meet to form a joint. Osteoarthritis is the most common form of arthritis. It often occurs in older people. The joints affected most often by this condition include those in the:  Ends of the fingers.  Thumbs.  Neck.  Lower back.  Knees.  Hips. CAUSES  Over time, the cartilage that covers the ends of bones begins to wear away. This causes bone to rub on bone, producing pain and stiffness in the affected joints.  RISK FACTORS Certain factors can  increase your chances of having osteoarthritis, including:  Older age.  Excessive body weight.  Overuse of joints.  Previous joint injury. SIGNS AND SYMPTOMS   Pain, swelling, and stiffness in the joint.  Over time, the joint may lose its normal shape.  Small deposits of bone (osteophytes) may grow on the edges of the joint.  Bits of bone or cartilage can break off and float inside the joint space. This may cause more pain and damage. DIAGNOSIS  Your health care provider will do a physical exam and ask about your symptoms. Various tests may be ordered, such as:  X-rays of the affected joint.  An MRI scan.  Blood tests to rule out other types of arthritis.  Joint fluid tests. This involves using a needle to draw fluid from the joint and examining the fluid under a microscope. TREATMENT  Goals of treatment are to control pain and improve joint function. Treatment plans may include:  A prescribed exercise program that allows for rest and joint relief.  A weight control plan.  Pain relief techniques, such as:  Properly applied heat and cold.  Electric pulses delivered to nerve endings under the skin (transcutaneous electrical nerve stimulation [TENS]).  Massage.  Certain nutritional supplements.  Medicines to control pain, such as:  Acetaminophen.  Nonsteroidal anti-inflammatory drugs (NSAIDs), such as naproxen.  Narcotic or central-acting agents, such as tramadol.  Corticosteroids. These can be given orally or as an injection.  Surgery to reposition the bones and relieve pain (osteotomy) or to remove loose pieces  of bone and cartilage. Joint replacement may be needed in advanced states of osteoarthritis. HOME CARE INSTRUCTIONS   Take medicines only as directed by your health care provider.  Maintain a healthy weight. Follow your health care provider's instructions for weight control. This may include dietary instructions.  Exercise as directed. Your health  care provider can recommend specific types of exercise. These may include:  Strengthening exercises. These are done to strengthen the muscles that support joints affected by arthritis. They can be performed with weights or with exercise bands to add resistance.  Aerobic activities. These are exercises, such as brisk walking or low-impact aerobics, that get your heart pumping.  Range-of-motion activities. These keep your joints limber.  Balance and agility exercises. These help you maintain daily living skills.  Rest your affected joints as directed by your health care provider.  Keep all follow-up visits as directed by your health care provider. SEEK MEDICAL CARE IF:   Your skin turns red.  You develop a rash in addition to your joint pain.  You have worsening joint pain.  You have a fever along with joint or muscle aches. SEEK IMMEDIATE MEDICAL CARE IF:  You have a significant loss of weight or appetite.  You have night sweats. [place] of Arthritis and Musculoskeletal and Skin Diseases: www.niams.SouthExposed.es  [employer] on Aging: http://kim-miller.com/  American College of Rheumatology: www.rheumatology.org Document Released: 12/15/2005 Document Revised: 05/01/2014 Document Reviewed: 08/22/2013 [facility] Patient Information 2015 [place], [place]. This information is not intended to replace advice given to you by your health care provider. Make sure you discuss any questions you have with your health care provider.  Rheumatoid Arthritis Rheumatoid arthritis is a long-term (chronic) inflammatory disease that causes pain, swelling, and stiffness of the joints. It can affect the entire body, including the eyes and lungs. The effects of rheumatoid arthritis vary widely among those with the condition. CAUSES  The cause of rheumatoid arthritis is not known. It tends to run in families and is more common in women. Certain cells of the body's natural  defense system (immune system) do not work properly and begin to attack healthy joints. It primarily involves the connective tissue that lines the joints (synovial membrane). This can cause damage to the joint. SYMPTOMS   Pain, stiffness, swelling, and decreased motion of many joints, especially in the hands and feet.  Stiffness that is worse in the morning. It may last 1-2 hours or longer.  Numbness and tingling in the hands.  Fatigue.  Loss of appetite.  Weight loss.  Low-grade fever.  Dry eyes and mouth.  Firm lumps (rheumatoid nodules) that grow beneath the skin in areas such as the elbows and hands. DIAGNOSIS  Diagnosis is based on the symptoms described, an exam, and blood tests. Sometimes, X-rays are helpful. TREATMENT  The goals of treatment are to relieve pain, reduce inflammation, and to slow down or stop joint damage and disability. Methods vary and may include:  Maintaining a balance of rest, exercise, and proper nutrition.  Medicines:  Pain relievers (analgesics).  Corticosteroids and nonsteroidal anti-inflammatory drugs (NSAIDs) to reduce inflammation.  Disease-modifying antirheumatic drugs (DMARDs) to try to slow the course of the disease.  Biologic response modifiers to reduce inflammation and damage.  Physical therapy and occupational therapy.  Surgery for patients with severe joint damage. Joint replacement or fusing of joints may be needed.  Routine monitoring and ongoing care, such as office visits, blood and urine tests, and X-rays. HOME  CARE INSTRUCTIONS   Remain physically active and reduce activity when the disease gets worse.  Eat a well-balanced diet.  Put heat on affected joints when you wake up and before activities. Keep the heat on the affected joint for as long as directed by your health care provider.  Put ice on affected joints following activities or exercising.  Put ice in a plastic bag.  Place a towel between your skin and the  bag.  Leave the ice on for 15-20 minutes, 3-4 times per day, or as directed by your health care provider.  Take medicines and supplements only as directed by your health care provider.  Use splints as directed by your health care provider. Splints help maintain joint position and function.  Do not sleep with pillows under your knees. This may lead to spasms.  Participate in a self-management program to keep current with the latest treatment and coping skills. SEEK IMMEDIATE MEDICAL CARE IF:  You have fainting episodes.  You have periods of extreme weakness.  You rapidly develop a hot, painful joint that is more severe than usual joint aches.  You have chills.  You have a fever. FOR MORE INFORMATION   American College of Rheumatology: www.rheumatology.[place]: www.arthritis.org Document Released: 12/12/2000 Document Revised: 05/01/2014 Document Reviewed: 01/21/2012 [facility] Patient Information 2015 [place], [place]. This information is not intended to replace advice given to you by your health care provider. Make sure you discuss any questions you have with your health care provider.

## 2015-07-13 NOTE — Progress Notes (Signed)
Pre visit review using our clinic review tool, if applicable. No additional management support is needed unless otherwise documented below in the visit note. 

## 2015-07-13 NOTE — Telephone Encounter (Signed)
Pt aware of results 

## 2015-07-13 NOTE — Progress Notes (Signed)
Subjective:    Patient ID: Sierra English, female    DOB: 10/22/1956, 59 y.o.   MRN: [id number]  Chief Complaint  Patient presents with  . Arthritis    having issues with her arthritis, hands and arms have been hurting, medication she has is not working    HPI:  Sierra English is a 59 y.o. female with a PMH of anxiety, asthma, B12 deficiency, dyslipidemia, hypertension, macular degeneration, and low back pain who presents today for an office visit.   1.) Arthritis -  Associated symptoms of pain located in her bilateral arms, shoulders, wrists, knees and ankles has been going on for a couple of years. She was previously prescribed mobic which she indicates does not help very much. Pain is described as both sharp and achy with a severity of that ranges depending on the time. Denies any additional modifying factors that make it better or worse.   Allergies  Allergen Reactions  . Hydrocodone   . Lidocaine Palpitations    Current Outpatient Prescriptions on File Prior to Visit  Medication Sig Dispense Refill  . albuterol (PROVENTIL HFA;VENTOLIN HFA) 108 (90 BASE) MCG/ACT inhaler Inhale 2 puffs into the lungs every 6 (six) hours as needed for wheezing or shortness of breath. 1 Inhaler 11  . Black Cohosh 175 MG CAPS Take 1 capsule by mouth daily. menopause    . carbamazepine (TEGRETOL) 200 MG tablet One half tablet twice daily for 2 weeks, then take 1 tablet twice daily 60 tablet 3  . Coenzyme Q10 (COQ-10) 200 MG CAPS Take 1 capsule by mouth every morning.    Marland Kitchen ESTRACE VAGINAL 0.1 MG/GM vaginal cream Place 1 Applicatorful vaginally once a week.     . mirtazapine (REMERON) 15 MG tablet Take 1 tablet (15 mg total) by mouth at bedtime. 30 tablet 0  . Multiple Vitamins-Minerals (EYE VITAMINS PO) Take by mouth daily.    Marland Kitchen VITAMIN D, CHOLECALCIFEROL, PO Take by mouth daily.    Marland Kitchen XYZAL 5 MG tablet 1 daily at bedtime for allergy as directed 30 tablet 11   No current  facility-administered medications on file prior to visit.   Past Medical History  Diagnosis Date  . ALLERGIC RHINITIS 08/29/2010  . ANXIETY 08/29/2010  . [place], [place] 08/29/2010  . CHEST PAIN 01/29/2011  . COLONIC POLYPS, HX OF 08/29/2010  . HYPERTENSION 08/29/2010  . Low back pain 05/27/2011  . MACULAR DEGENERATION 08/29/2010  . PARESTHESIA 01/29/2011  . THORACIC/LUMBOSACRAL NEURITIS/RADICULITIS UNSPEC 02/28/2011  . Hyperlipidemia     Review of Systems  Constitutional: Negative for fever and chills.  Endocrine: Negative for cold intolerance and heat intolerance.  Musculoskeletal: Positive for arthralgias.      Objective:    BP 130/84 mmHg  Pulse 63  Temp(Src) 98.2 F (36.8 C) (Oral)  Resp 18  Ht $R'5\' 2"'nD$  (1.575 m)  Wt 133 lb 12.8 oz (60.691 kg)  BMI 24.47 kg/m2  SpO2 99% Nursing note and vital signs reviewed.  Physical Exam  Constitutional: She is oriented to person, place, and time. She appears well-developed and well-nourished. No distress.  Cardiovascular: Normal rate, regular rhythm, normal heart sounds and intact distal pulses.   Pulmonary/Chest: Effort normal and breath sounds normal.  Musculoskeletal:  Multiple arthralgias with no obvious deformity or discoloration. Mild sporadic edema in multiple joints. ROM is normal. Pulses are intact and appropriate.   Neurological: She is alert and oriented to person, place, and time.  Skin: Skin is warm and dry.  Psychiatric: She  has a normal mood and affect. Her behavior is normal. Judgment and thought content normal.      Assessment & Plan:   Problem List Items Addressed This Visit      Other   Arthralgia - Primary    Arthralgias previously treated as osteoarthritis with mobic which has become less effective. Question the possibility of rhuematoid arthritis. Obtain ANA, CRP, rhuematoid factor and ESR.  Start naproxen. Follow up pending lab work.       Relevant Medications   naproxen (NAPROSYN) 500 MG tablet   Other Relevant  Orders   Rheumatoid Factor   Antinuclear Antib (ANA)   C-reactive protein   Sed Rate (ESR)

## 2015-07-13 NOTE — Assessment & Plan Note (Signed)
Arthralgias previously treated as osteoarthritis with mobic which has become less effective. Question the possibility of rhuematoid arthritis. Obtain ANA, CRP, rhuematoid factor and ESR.  Start naproxen. Follow up pending lab work.

## 2015-07-16 ENCOUNTER — Telehealth: Payer: Self-pay | Admitting: Family

## 2015-07-16 LAB — ANA: Anti Nuclear Antibody(ANA): NEGATIVE

## 2015-07-16 NOTE — Telephone Encounter (Signed)
Please inform patient that her blood work shows that she does not have rheumatoid arthritis or Lupus as all of her blood work has come back normal. Therefore we cannot rule out the pain she is experiencing as osteoporosis. Therefore the Morrie Sheldon which she was curious about is not appropriate for her current condition. I am more than happy to forward her to rheumatology for a second if she would like.

## 2015-07-17 NOTE — Telephone Encounter (Signed)
I do not believe that it is necessary for her to go.

## 2015-07-17 NOTE — Telephone Encounter (Signed)
Pt is aware of results. She would like to know if you really think its necessary to go to rheumatology if all the other tests have came back negative. She said she will go if you really think its necessary. Please advise

## 2015-07-18 ENCOUNTER — Ambulatory Visit: Payer: BLUE CROSS/BLUE SHIELD | Admitting: General Surgery

## 2015-07-19 ENCOUNTER — Ambulatory Visit (INDEPENDENT_AMBULATORY_CARE_PROVIDER_SITE_OTHER): Payer: BLUE CROSS/BLUE SHIELD

## 2015-07-19 DIAGNOSIS — J309 Allergic rhinitis, unspecified: Secondary | ICD-10-CM | POA: Diagnosis not present

## 2015-07-20 ENCOUNTER — Encounter: Payer: Self-pay | Admitting: Internal Medicine

## 2015-07-25 ENCOUNTER — Telehealth: Payer: Self-pay | Admitting: Internal Medicine

## 2015-07-25 ENCOUNTER — Ambulatory Visit (INDEPENDENT_AMBULATORY_CARE_PROVIDER_SITE_OTHER): Payer: BLUE CROSS/BLUE SHIELD

## 2015-07-25 DIAGNOSIS — J309 Allergic rhinitis, unspecified: Secondary | ICD-10-CM | POA: Diagnosis not present

## 2015-07-25 MED ORDER — XYZAL 5 MG PO TABS: ORAL_TABLET | ORAL | Status: DC

## 2015-07-25 NOTE — Telephone Encounter (Signed)
Pt in the lobby waiting

## 2015-07-25 NOTE — Telephone Encounter (Signed)
Pt informed of Dr Janee Morn recommendations regarding allergy shots.  Pt verbalized understanding.

## 2015-07-25 NOTE — Telephone Encounter (Signed)
She will skip allergy shots while out of the country. Depending how long she is gone, we will decide what to do about dropping back on the shot strength to restart.

## 2015-07-25 NOTE — Telephone Encounter (Signed)
Spoke with pt. She is needing a refill on Xyzal. She will being going out of the country and will need a 90 day supply. This has been sent to [place] her that she will need an appointment with CY soon. She was also wanting to know what she should do about her allergy injections while out of the country.  CY - please advise. Thanks.

## 2015-07-26 ENCOUNTER — Ambulatory Visit: Payer: BLUE CROSS/BLUE SHIELD

## 2015-08-02 ENCOUNTER — Ambulatory Visit (INDEPENDENT_AMBULATORY_CARE_PROVIDER_SITE_OTHER): Payer: BLUE CROSS/BLUE SHIELD

## 2015-08-02 DIAGNOSIS — J309 Allergic rhinitis, unspecified: Secondary | ICD-10-CM | POA: Diagnosis not present

## 2015-08-09 ENCOUNTER — Ambulatory Visit: Payer: BLUE CROSS/BLUE SHIELD

## 2015-08-10 ENCOUNTER — Telehealth: Payer: Self-pay | Admitting: Internal Medicine

## 2015-08-10 ENCOUNTER — Ambulatory Visit (INDEPENDENT_AMBULATORY_CARE_PROVIDER_SITE_OTHER): Payer: BLUE CROSS/BLUE SHIELD

## 2015-08-10 DIAGNOSIS — J309 Allergic rhinitis, unspecified: Secondary | ICD-10-CM | POA: Diagnosis not present

## 2015-08-10 NOTE — Telephone Encounter (Signed)
Patient is currently at pharmacy and needed approval for Generic Xyzal.   Medication was not marked as "DAW"  ok'd generic Nothing further needed.

## 2015-08-10 NOTE — Telephone Encounter (Signed)
Patient scheduled to have allergy shot today. Patient notified. Nothing further needed.

## 2015-08-10 NOTE — Telephone Encounter (Signed)
Needs generic med, not brand name for what Dr. Annamaria Boots ordered.  The patient is at the pharmacy, [facility] on [address].

## 2015-08-16 ENCOUNTER — Ambulatory Visit (INDEPENDENT_AMBULATORY_CARE_PROVIDER_SITE_OTHER): Payer: BLUE CROSS/BLUE SHIELD

## 2015-08-16 DIAGNOSIS — J309 Allergic rhinitis, unspecified: Secondary | ICD-10-CM

## 2015-08-30 ENCOUNTER — Encounter: Payer: Self-pay | Admitting: *Deleted

## 2015-09-12 ENCOUNTER — Encounter: Payer: Self-pay | Admitting: Internal Medicine

## 2015-10-05 ENCOUNTER — Other Ambulatory Visit (INDEPENDENT_AMBULATORY_CARE_PROVIDER_SITE_OTHER): Payer: BLUE CROSS/BLUE SHIELD

## 2015-10-05 ENCOUNTER — Encounter: Payer: Self-pay | Admitting: Internal Medicine

## 2015-10-05 ENCOUNTER — Ambulatory Visit (INDEPENDENT_AMBULATORY_CARE_PROVIDER_SITE_OTHER): Payer: BLUE CROSS/BLUE SHIELD | Admitting: Internal Medicine

## 2015-10-05 VITALS — BP 136/80 | HR 102 | Temp 98.9°F | Resp 18 | Ht 62.0 in | Wt 129.1 lb

## 2015-10-05 DIAGNOSIS — M2550 Pain in unspecified joint: Secondary | ICD-10-CM

## 2015-10-05 DIAGNOSIS — Z0000 Encounter for general adult medical examination without abnormal findings: Secondary | ICD-10-CM | POA: Diagnosis not present

## 2015-10-05 DIAGNOSIS — B349 Viral infection, unspecified: Secondary | ICD-10-CM | POA: Diagnosis not present

## 2015-10-05 LAB — RHEUMATOID FACTOR: Rhuematoid fact SerPl-aCnc: <10 IU/mL (ref <=14)

## 2015-10-05 LAB — SEDIMENTATION RATE: SED RATE: 15 mm/h (ref 0–22)

## 2015-10-05 MED ORDER — BUTALBITAL-APAP-CAFFEINE 50-500-40 MG PO TABS: 1.0000 | ORAL_TABLET | ORAL | Status: DC | PRN

## 2015-10-05 NOTE — Patient Instructions (Signed)
We will give you a prescription for the pain that you can take up to 3 times per day.   The symptoms should go away in the next week. We will call you back with the blood work. Some will come back today and some will come back over the weekend.

## 2015-10-05 NOTE — Assessment & Plan Note (Addendum)
This would appear to be the most likely etiology as she was recently traveling and around lots of people. No more diarrhea making traveler's diarrhea less likely so no antibiotics. Checking ESR to rule out temporal arteritis. No fevers or chills and Kernig negative making meningitis unlikely. Talked to them about return precautions with worsening headache, fevers, confusion to ER. Also checking some RF, ANA given that the history is poor for the joint pain to rule out auto-immune process. Advised to continue with tylenol for ache.

## 2015-10-05 NOTE — Progress Notes (Signed)
   Subjective:    Patient ID: Sierra English, female    DOB: 1956-01-17, 59 y.o.   MRN: [id number]  HPI The patient is a 59 YO female coming in for 1 day of diarrhea, body aches, fatigue, and headache. She returned from the [place] this week. Had diarrhea many times on the plane returning. No diarrhea since that time. Now having body aches and fatigue. Mild headache as well. No fevers or chills. Denies being around anyone sick and no one else at her home is sick. Took some different medications in the [place] that she was given there for stress (losartan and ativan) which she is no longer taking. She was in the [place] for about 1-2 months. Not sick while she was there. Taking tylenol for the aches which is helping moderately. Denies joint swelling but is having pains in her joints. Her english is not great which limits our communication some.   Review of Systems  Constitutional: Positive for activity change and fatigue. Negative for fever, chills, appetite change and unexpected weight change.  Eyes: Negative.   Respiratory: Negative for cough, chest tightness, shortness of breath and wheezing.   Cardiovascular: Negative for chest pain and leg swelling.  Gastrointestinal: Positive for diarrhea. Negative for abdominal pain, constipation and abdominal distention.       1 day now resolved  Musculoskeletal: Positive for myalgias, back pain and arthralgias.  Skin: Negative.   Neurological: Positive for headaches. Negative for dizziness, seizures, syncope, weakness and numbness.  Psychiatric/Behavioral: Negative.       Objective:   Physical Exam  Constitutional: She is oriented to person, place, and time. She appears well-developed and well-nourished.  HENT:  Head: Normocephalic and atraumatic.  Right Ear: External ear normal.  Left Ear: External ear normal.  Mouth/Throat: Oropharynx is clear and moist.  Eyes: EOM are normal.  Neck: Normal range of motion. Neck supple.  Kernig negative    Cardiovascular: Normal rate and regular rhythm.   Pulmonary/Chest: Effort normal and breath sounds normal. No respiratory distress. She has no wheezes. She has no rales.  Abdominal: Soft. Bowel sounds are normal. She exhibits no distension. There is no tenderness. There is no rebound.  Musculoskeletal: She exhibits no edema.  Neurological: She is alert and oriented to person, place, and time. Coordination normal.  Skin: Skin is warm and dry. No rash noted.   Filed Vitals:   10/05/15 1353  BP: 136/80  Pulse: 102  Temp: 98.9 F (37.2 C)  TempSrc: Oral  Resp: 18  Height: 5\' 2"  (1.575 m)  Weight: 129 lb 1.9 oz (58.568 kg)  SpO2: 97%      Assessment & Plan:

## 2015-10-05 NOTE — Progress Notes (Signed)
Pre visit review using our clinic review tool, if applicable. No additional management support is needed unless otherwise documented below in the visit note. 

## 2015-10-08 LAB — ANA: Anti Nuclear Antibody(ANA): NEGATIVE

## 2015-10-09 ENCOUNTER — Telehealth: Payer: Self-pay | Admitting: Family

## 2015-10-09 NOTE — Telephone Encounter (Signed)
Spoke with patient. Labs were normal. She will follow up with PCP

## 2015-10-09 NOTE — Telephone Encounter (Signed)
Patient is calling regarding lab results. She is still having a bad headache Can you please call her at [phone]

## 2015-10-29 ENCOUNTER — Telehealth: Payer: Self-pay | Admitting: Internal Medicine

## 2015-10-29 ENCOUNTER — Ambulatory Visit (INDEPENDENT_AMBULATORY_CARE_PROVIDER_SITE_OTHER): Payer: BLUE CROSS/BLUE SHIELD

## 2015-10-29 DIAGNOSIS — J309 Allergic rhinitis, unspecified: Secondary | ICD-10-CM

## 2015-10-29 NOTE — Telephone Encounter (Signed)
Pt. Has been out of the country for 2 months. Please ref. To 07-25-15 ph. Note. Her last shot 08/16/15 1:50 Vial A 0.1 LA Vial B 0.5 RA. What strength and dose do I start her at?Please advise, pt. Is waiting.

## 2015-10-29 NOTE — Telephone Encounter (Signed)
Per Dr. Annamaria Boots give 0.1 out of ea. Vial. I asked pt. To wait since it's been 2 months. Pt may have waited 2 to 3  Mins.. But when I went to check on her after 7 mins. She was gone. I called her a lil' before 5:00 p.m. Arms are fine no redness,itching or swelling.Pt. Is coming next Mon. For her next shot.

## 2015-10-29 NOTE — Telephone Encounter (Signed)
Noted  

## 2015-11-05 ENCOUNTER — Ambulatory Visit (INDEPENDENT_AMBULATORY_CARE_PROVIDER_SITE_OTHER): Payer: BLUE CROSS/BLUE SHIELD

## 2015-11-05 DIAGNOSIS — J309 Allergic rhinitis, unspecified: Secondary | ICD-10-CM

## 2015-11-12 ENCOUNTER — Ambulatory Visit: Payer: BLUE CROSS/BLUE SHIELD

## 2015-11-13 ENCOUNTER — Ambulatory Visit (INDEPENDENT_AMBULATORY_CARE_PROVIDER_SITE_OTHER): Payer: BLUE CROSS/BLUE SHIELD

## 2015-11-13 DIAGNOSIS — J309 Allergic rhinitis, unspecified: Secondary | ICD-10-CM

## 2015-11-19 ENCOUNTER — Ambulatory Visit (INDEPENDENT_AMBULATORY_CARE_PROVIDER_SITE_OTHER): Payer: BLUE CROSS/BLUE SHIELD

## 2015-11-19 DIAGNOSIS — J309 Allergic rhinitis, unspecified: Secondary | ICD-10-CM | POA: Diagnosis not present

## 2015-11-26 ENCOUNTER — Ambulatory Visit (INDEPENDENT_AMBULATORY_CARE_PROVIDER_SITE_OTHER): Payer: BLUE CROSS/BLUE SHIELD

## 2015-11-26 DIAGNOSIS — J309 Allergic rhinitis, unspecified: Secondary | ICD-10-CM

## 2015-12-03 ENCOUNTER — Ambulatory Visit (INDEPENDENT_AMBULATORY_CARE_PROVIDER_SITE_OTHER): Payer: BLUE CROSS/BLUE SHIELD

## 2015-12-03 DIAGNOSIS — J309 Allergic rhinitis, unspecified: Secondary | ICD-10-CM

## 2015-12-10 ENCOUNTER — Ambulatory Visit (INDEPENDENT_AMBULATORY_CARE_PROVIDER_SITE_OTHER): Payer: BLUE CROSS/BLUE SHIELD

## 2015-12-10 DIAGNOSIS — J309 Allergic rhinitis, unspecified: Secondary | ICD-10-CM | POA: Diagnosis not present

## 2015-12-12 ENCOUNTER — Telehealth: Payer: Self-pay | Admitting: Family

## 2015-12-12 MED ORDER — ALPRAZOLAM 0.25 MG PO TABS: 0.2500 mg | ORAL_TABLET | Freq: Every day | ORAL | Status: DC | PRN

## 2015-12-12 NOTE — Telephone Encounter (Signed)
12.14.16 Pt is requesting refill on alprazolam. Pt is also requesting to switch pharmacies to [facility] on [address]. Call with any questions or concerns. MS

## 2015-12-12 NOTE — Telephone Encounter (Signed)
Faxed script back to [facility].../lmb

## 2015-12-12 NOTE — Telephone Encounter (Signed)
Medication filled.  

## 2015-12-17 ENCOUNTER — Ambulatory Visit: Payer: BLUE CROSS/BLUE SHIELD

## 2015-12-17 ENCOUNTER — Encounter: Payer: Self-pay | Admitting: Internal Medicine

## 2015-12-18 ENCOUNTER — Ambulatory Visit (INDEPENDENT_AMBULATORY_CARE_PROVIDER_SITE_OTHER): Payer: BLUE CROSS/BLUE SHIELD

## 2015-12-18 DIAGNOSIS — J309 Allergic rhinitis, unspecified: Secondary | ICD-10-CM

## 2015-12-19 ENCOUNTER — Telehealth: Payer: Self-pay | Admitting: Internal Medicine

## 2015-12-19 NOTE — Telephone Encounter (Signed)
Allergy Serum Extract Date Mixed: 12/19/15 Vial: 1 Vial B ran out. Pt. Has 2 vials, she gets 0.5 out of vial B and less out of vial A Strength: 1:50 Here/Mail/Pick Up: here Mixed By: tbs Last OV: 10/30/14 Pending OV: N/A

## 2015-12-20 DIAGNOSIS — J309 Allergic rhinitis, unspecified: Secondary | ICD-10-CM | POA: Diagnosis not present

## 2015-12-25 ENCOUNTER — Ambulatory Visit: Payer: BLUE CROSS/BLUE SHIELD

## 2016-01-01 ENCOUNTER — Telehealth: Payer: Self-pay | Admitting: Internal Medicine

## 2016-01-01 ENCOUNTER — Ambulatory Visit (INDEPENDENT_AMBULATORY_CARE_PROVIDER_SITE_OTHER): Payer: BLUE CROSS/BLUE SHIELD

## 2016-01-01 DIAGNOSIS — J309 Allergic rhinitis, unspecified: Secondary | ICD-10-CM | POA: Diagnosis not present

## 2016-01-01 NOTE — Telephone Encounter (Signed)
Please schedule patient next opening(which should be in springtime); pt must understand she has to keep OV to continue getting her allergy serum. Please let Alroy Bailiff know of appt date so she will know about mixing vaccine for patient.

## 2016-01-01 NOTE — Telephone Encounter (Signed)
Pt cb, informed her she needed ov with CY, she did not understand why she needed appt, so i had to clarify with Joellen Jersey who states patient was l;ast seen 10/2014 and needs 35yr f/u, and let Alroy Bailiff know when patient is scheduled so she can mix enough serum to last until then please cb at previous  Number listed

## 2016-01-01 NOTE — Telephone Encounter (Signed)
lmtcb x1 for pt NEEDS OV

## 2016-01-01 NOTE — Telephone Encounter (Signed)
lmtcb x1 for pt. 

## 2016-01-01 NOTE — Telephone Encounter (Signed)
Patient states that she does not have a problem this time of the year and she would prefer to come to see Dr. Annamaria Boots in the springtime.  She said that she would like to wait and schedule her appointment in the spring when she has allergy issues.  To Katie for follow up

## 2016-01-02 NOTE — Telephone Encounter (Signed)
Spoke with the pt and notified she must schedule appt and keep appt  She declined to do so at this time and states that she will call us back when she is ready to schedule  Will close encounter and forward to TS to make her aware

## 2016-01-08 ENCOUNTER — Ambulatory Visit (INDEPENDENT_AMBULATORY_CARE_PROVIDER_SITE_OTHER): Payer: BLUE CROSS/BLUE SHIELD

## 2016-01-08 DIAGNOSIS — J309 Allergic rhinitis, unspecified: Secondary | ICD-10-CM

## 2016-01-08 NOTE — Telephone Encounter (Signed)
Noted  

## 2016-01-08 NOTE — Telephone Encounter (Signed)
I spoke with pt. About making a yearly appt.(again) With [facility] and explained to her that her all.vac. Is like any other rx she needs to see you at least once a yr. To renew it. She said she understood, that she had a lot going on for the next few wks. But she would make an appt. In Mar.. I also explained to her about making an appt. Every wk. "I don't want to do that." I told her her ins. Was enforcing it. I also told her she could make appts. As much as 12 wks. Out, she seemed to be ok with that. I noticed she did make an inj. Appt. For next Tues.

## 2016-01-15 ENCOUNTER — Ambulatory Visit (INDEPENDENT_AMBULATORY_CARE_PROVIDER_SITE_OTHER): Payer: BLUE CROSS/BLUE SHIELD

## 2016-01-15 DIAGNOSIS — J309 Allergic rhinitis, unspecified: Secondary | ICD-10-CM

## 2016-01-21 ENCOUNTER — Telehealth: Payer: Self-pay | Admitting: Family

## 2016-01-21 DIAGNOSIS — Z0000 Encounter for general adult medical examination without abnormal findings: Secondary | ICD-10-CM

## 2016-01-21 NOTE — Telephone Encounter (Signed)
Please advise on what labs need to be put in.

## 2016-01-21 NOTE — Telephone Encounter (Signed)
Patient is requesting to have CPE labs placed for upcoming 02/19/2016 appointment. i advised her that she is assuming the risk of her insurance not covering

## 2016-01-22 ENCOUNTER — Ambulatory Visit (INDEPENDENT_AMBULATORY_CARE_PROVIDER_SITE_OTHER): Payer: BLUE CROSS/BLUE SHIELD

## 2016-01-22 DIAGNOSIS — J309 Allergic rhinitis, unspecified: Secondary | ICD-10-CM

## 2016-01-24 NOTE — Telephone Encounter (Signed)
Orders placed.

## 2016-01-24 NOTE — Telephone Encounter (Signed)
Pt is aware.  

## 2016-01-29 ENCOUNTER — Ambulatory Visit (INDEPENDENT_AMBULATORY_CARE_PROVIDER_SITE_OTHER): Payer: BLUE CROSS/BLUE SHIELD

## 2016-01-29 DIAGNOSIS — J309 Allergic rhinitis, unspecified: Secondary | ICD-10-CM | POA: Diagnosis not present

## 2016-01-30 ENCOUNTER — Telehealth: Payer: Self-pay | Admitting: Internal Medicine

## 2016-01-30 NOTE — Telephone Encounter (Signed)
Error.Stanley A Dalton ° °

## 2016-02-01 ENCOUNTER — Other Ambulatory Visit (INDEPENDENT_AMBULATORY_CARE_PROVIDER_SITE_OTHER): Payer: BLUE CROSS/BLUE SHIELD

## 2016-02-01 ENCOUNTER — Telehealth: Payer: Self-pay | Admitting: Internal Medicine

## 2016-02-01 DIAGNOSIS — Z0000 Encounter for general adult medical examination without abnormal findings: Secondary | ICD-10-CM | POA: Diagnosis not present

## 2016-02-01 LAB — COMPREHENSIVE METABOLIC PANEL
ALT: 18 U/L (ref 0–35)
AST: 17 U/L (ref 0–37)
Albumin: 4.4 g/dL (ref 3.5–5.2)
Alkaline Phosphatase: 100 U/L (ref 39–117)
BILIRUBIN TOTAL: 1.4 mg/dL — AB (ref 0.2–1.2)
BUN: 14 mg/dL (ref 6–23)
CO2: 28 meq/L (ref 19–32)
Calcium: 10.1 mg/dL (ref 8.4–10.5)
Chloride: 104 mEq/L (ref 96–112)
Creatinine, Ser: 0.75 mg/dL (ref 0.40–1.20)
GFR: 83.69 mL/min (ref >60.00)
GLUCOSE: 116 mg/dL — AB (ref 70–99)
Potassium: 4.1 mEq/L (ref 3.5–5.1)
SODIUM: 140 meq/L (ref 135–145)
Total Protein: 7.5 g/dL (ref 6.0–8.3)

## 2016-02-01 LAB — CBC
HCT: 43.2 % (ref 36.0–46.0)
HEMOGLOBIN: 14.4 g/dL (ref 12.0–15.0)
MCHC: 33.3 g/dL (ref 30.0–36.0)
MCV: 85.7 fl (ref 78.0–100.0)
PLATELETS: 275 10*3/uL (ref 150.0–400.0)
RBC: 5.04 Mil/uL (ref 3.87–5.11)
RDW: 13 % (ref 11.5–15.5)
WBC: 7.8 10*3/uL (ref 4.0–10.5)

## 2016-02-01 LAB — LIPID PANEL
Cholesterol: 216 mg/dL — ABNORMAL HIGH (ref 0–200)
HDL: 45.4 mg/dL (ref >39.00)
LDL CALC: 146 mg/dL — AB (ref 0–99)
NonHDL: 170.92
TRIGLYCERIDES: 127 mg/dL (ref 0.0–149.0)
Total CHOL/HDL Ratio: 5
VLDL: 25.4 mg/dL (ref 0.0–40.0)

## 2016-02-01 LAB — TSH: TSH: 4.57 u[IU]/mL — ABNORMAL HIGH (ref 0.35–4.50)

## 2016-02-01 MED ORDER — LEVOCETIRIZINE DIHYDROCHLORIDE 5 MG PO TABS: ORAL_TABLET | ORAL | Status: DC

## 2016-02-01 NOTE — Telephone Encounter (Signed)
Spoke with pt, states she wants to hold on her allergy injections d/t insurance deductible- wants to know how long she can go without her vaccine before she has to "start over" with working up to her maintenance dosage.  I advised pt that since she is on her maintenance dose (1:50-0.75ml qw) that if she goes more than 2 weeks without her injection that we would have to begin lowering her vaccine amount to build her back up to her maintenance dose, but her time off of the injections would determine how far she has to backtrack with her vaccine buildup.  Pt is requesting CY's recs on this matter.    CY please advise.  Thanks.

## 2016-02-01 NOTE — Telephone Encounter (Signed)
Called spoke with pt and made aware of below. She verbalized understanding and needed nothing further.   

## 2016-02-01 NOTE — Telephone Encounter (Signed)
You have instructed her correctly. She can use otc antihistamines and flonase nasal spray as needed instead of allergy shots.

## 2016-02-04 ENCOUNTER — Telehealth: Payer: Self-pay | Admitting: Family

## 2016-02-04 NOTE — Telephone Encounter (Signed)
Please inform patient that her kidney function, liver function, white/red blood cells, and electrolytes are all within the normal ranges. Her thyroid is slightly elevated and if she is symptomatic including fatigue, weight gain, or changes to her skin, hair, or nails we can consider starting thyroid medication after a confirmation test of her thyroid function. Lastly her cholesterol remains elevated with her LDL or bad cholesterol elevated at 143 and her HDL is in a good range at 45. Her calculated risk for coronary artery disease in the next 10 years is elevated at 9% which is above the threshold of 7.5% for starting cholesterol medication. Therefore it is my recommendation that we start her on cholesterol medication if she is willing. I would like to combine this with nutrition and physical activity changes to help decrease her overall risk. If she is willing and like start her on a dose of either pravastatin or atorvastatin.

## 2016-02-05 ENCOUNTER — Ambulatory Visit (INDEPENDENT_AMBULATORY_CARE_PROVIDER_SITE_OTHER): Payer: BLUE CROSS/BLUE SHIELD

## 2016-02-05 DIAGNOSIS — J309 Allergic rhinitis, unspecified: Secondary | ICD-10-CM

## 2016-02-07 NOTE — Telephone Encounter (Signed)
Pt aware of results 

## 2016-02-12 ENCOUNTER — Ambulatory Visit (INDEPENDENT_AMBULATORY_CARE_PROVIDER_SITE_OTHER): Payer: BLUE CROSS/BLUE SHIELD

## 2016-02-12 DIAGNOSIS — J309 Allergic rhinitis, unspecified: Secondary | ICD-10-CM

## 2016-02-19 ENCOUNTER — Encounter: Payer: Self-pay | Admitting: Family

## 2016-02-19 ENCOUNTER — Other Ambulatory Visit (INDEPENDENT_AMBULATORY_CARE_PROVIDER_SITE_OTHER): Payer: BLUE CROSS/BLUE SHIELD

## 2016-02-19 ENCOUNTER — Ambulatory Visit (INDEPENDENT_AMBULATORY_CARE_PROVIDER_SITE_OTHER): Payer: BLUE CROSS/BLUE SHIELD

## 2016-02-19 ENCOUNTER — Telehealth: Payer: Self-pay | Admitting: Family

## 2016-02-19 ENCOUNTER — Ambulatory Visit (INDEPENDENT_AMBULATORY_CARE_PROVIDER_SITE_OTHER): Payer: BLUE CROSS/BLUE SHIELD | Admitting: Family

## 2016-02-19 VITALS — BP 134/76 | HR 80 | Temp 98.3°F | Resp 16 | Ht 62.0 in | Wt 132.0 lb

## 2016-02-19 DIAGNOSIS — E785 Hyperlipidemia, unspecified: Secondary | ICD-10-CM | POA: Diagnosis not present

## 2016-02-19 DIAGNOSIS — R946 Abnormal results of thyroid function studies: Secondary | ICD-10-CM

## 2016-02-19 DIAGNOSIS — J309 Allergic rhinitis, unspecified: Secondary | ICD-10-CM | POA: Diagnosis not present

## 2016-02-19 DIAGNOSIS — R7989 Other specified abnormal findings of blood chemistry: Secondary | ICD-10-CM

## 2016-02-19 LAB — T4: T4 TOTAL: 7.1 ug/dL (ref 4.5–12.0)

## 2016-02-19 LAB — TSH: TSH: 2.49 u[IU]/mL (ref 0.35–4.50)

## 2016-02-19 MED ORDER — PRAVASTATIN SODIUM 20 MG PO TABS: 20.0000 mg | ORAL_TABLET | Freq: Every day | ORAL | Status: AC

## 2016-02-19 NOTE — Patient Instructions (Addendum)
Thank you for choosing [facility].  Summary/Instructions:  Your prescription(s) have been submitted to your pharmacy or been printed and provided for you. Please take as directed and contact our office if you believe you are having problem(s) with the medication(s) or have any questions. If your symptoms worsen or fail to improve, please contact our office for further instruction, or in case of emergency go directly to the emergency room at the closest medical facility.    Cholesterol Cholesterol is a white, waxy, fat-like substance needed by your body in small amounts. The liver makes all the cholesterol you need. Cholesterol is carried from the liver by the blood through the blood vessels. Deposits of cholesterol (plaque) may build up on blood vessel walls. These make the arteries narrower and stiffer. Cholesterol plaques increase the risk for heart attack and stroke.  You cannot feel your cholesterol level even if it is very high. The only way to know it is high is with a blood test. Once you know your cholesterol levels, you should keep a record of the test results. Work with your health care provider to keep your levels in the desired range.  WHAT DO THE RESULTS MEAN?  Total cholesterol is a rough measure of all the cholesterol in your blood.   LDL is the so-called bad cholesterol. This is the type that deposits cholesterol in the walls of the arteries. You want this level to be low.   HDL is the good cholesterol because it cleans the arteries and carries the LDL away. You want this level to be high.  Triglycerides are fat that the body can either burn for energy or store. High levels are closely linked to heart disease.  WHAT ARE THE DESIRED LEVELS OF CHOLESTEROL?  Total cholesterol below 200.   LDL below 100 for people at risk, below 70 for those at very high risk.   HDL above 50 is good, above 60 is best.   Triglycerides below 150.  HOW CAN I LOWER MY  CHOLESTEROL?  Diet. Follow your diet programs as directed by your health care provider.   Choose fish or white meat chicken and [place], roasted or baked. Limit fatty cuts of red meat, fried foods, and processed meats, such as sausage and lunch meats.   Eat lots of fresh fruits and vegetables.  Choose whole grains, beans, pasta, potatoes, and cereals.   Use only small amounts of olive, corn, or canola oils.   Avoid butter, mayonnaise, shortening, or palm kernel oils.  Avoid foods with trans fats.   Drink skim or nonfat milk and eat low-fat or nonfat yogurt and cheeses. Avoid whole milk, cream, ice cream, egg yolks, and full-fat cheeses.   Healthy desserts include angel food cake, ginger snaps, animal crackers, hard candy, popsicles, and low-fat or nonfat frozen yogurt. Avoid pastries, cakes, pies, and cookies.   Exercise. Follow your exercise programs as directed by your health care provider.   A regular program helps decrease LDL and raise HDL.   A regular program helps with weight control.   Do things that increase your activity level like gardening, walking, or taking the stairs. Ask your health care provider about how you can be more active in your daily life.   Medicine. Take medicine only as directed by your health care provider.   Medicine may be prescribed by your health care provider to help lower cholesterol and decrease the risk for heart disease.   If you have several risk factors, you may  need medicine even if your levels are normal.   This information is not intended to replace advice given to you by your health care provider. Make sure you discuss any questions you have with your health care provider.   Document Released: 09/09/2001 Document Revised: 01/05/2015 Document Reviewed: 09/28/2013 Elsevier Interactive Patient Education 2016 [employer].  Hypothyroidism Hypothyroidism is a disorder of the thyroid. The thyroid is a large gland that is  located in the lower front of the neck. The thyroid releases hormones that control how the body works. With hypothyroidism, the thyroid does not make enough of these hormones. CAUSES Causes of hypothyroidism may include:  Viral infections.  Pregnancy.  Your own defense system (immune system) attacking your thyroid.  Certain medicines.  Birth defects.  Past radiation treatments to your head or neck.  Past treatment with radioactive iodine.  Past surgical removal of part or all of your thyroid.  Problems with the gland that is located in the center of your brain (pituitary). SIGNS AND SYMPTOMS Signs and symptoms of hypothyroidism may include:  Feeling as though you have no energy (lethargy).  Inability to tolerate cold.  Weight gain that is not explained by a change in diet or exercise habits.  Dry skin.  Coarse hair.  Menstrual irregularity.  Slowing of thought processes.  Constipation.  Sadness or depression. DIAGNOSIS  Your health care provider may diagnose hypothyroidism with blood tests and ultrasound tests. TREATMENT Hypothyroidism is treated with medicine that replaces the hormones that your body does not make. After you begin treatment, it may take several weeks for symptoms to go away. HOME CARE INSTRUCTIONS   Take medicines only as directed by your health care provider.  If you start taking any new medicines, tell your health care provider.  Keep all follow-up visits as directed by your health care provider. This is important. As your condition improves, your dosage needs may change. You will need to have blood tests regularly so that your health care provider can watch your condition. SEEK MEDICAL CARE IF:  Your symptoms do not get better with treatment.  You are taking thyroid replacement medicine and:  You sweat excessively.  You have tremors.  You feel anxious.  You lose weight rapidly.  You cannot tolerate heat.  You have emotional  swings.  You have diarrhea.  You feel weak. SEEK IMMEDIATE MEDICAL CARE IF:   You develop chest pain.  You develop an irregular heartbeat.  You develop a rapid heartbeat.   This information is not intended to replace advice given to you by your health care provider. Make sure you discuss any questions you have with your health care provider.   Document Released: 12/15/2005 Document Revised: 01/05/2015 Document Reviewed: 05/02/2014 Elsevier Interactive Patient Education [employer].

## 2016-02-19 NOTE — Telephone Encounter (Signed)
Please inform patient that her TSH is within the normal limits close to the higher limits. Therefore we do not need to pursue the ultrasound of your thyroid at this time. We can work to improve your thyroid function through medication if you would like to try a low dose thyroid medication.

## 2016-02-19 NOTE — Progress Notes (Signed)
Pre visit review using our clinic review tool, if applicable. No additional management support is needed unless otherwise documented below in the visit note. 

## 2016-02-19 NOTE — Assessment & Plan Note (Signed)
Elevated TSH on most recent lab work. Recheck TSH and T4. Obtain ultrasound pending lab work. Consider therapy if TSH remains elevated.

## 2016-02-19 NOTE — Assessment & Plan Note (Signed)
Blood work consistent with hyperlipidemia with CAD risk factor of 9% in the next 10 years. Qualifies for statin therapy. Start pravastatin. Risk and side effects discussed. Recommend a low fat, decreased processed/sugary foods and continued physical activity. Follow up in 3 months.

## 2016-02-19 NOTE — Progress Notes (Addendum)
Subjective:    Patient ID: Sierra English, female    DOB: Apr 18, 1956, 60 y.o.   MRN: [id number]  Chief Complaint  Patient presents with  . Follow-up    had labs done a few weeks ago, thyroid and cholesterol were out of range would like to discuss further    HPI:  Sierra English is a 60 y.o. female who  has a past medical history of ALLERGIC RHINITIS (08/29/2010); ANXIETY (08/29/2010); CHEILITIS, ANGULAR (08/29/2010); CHEST PAIN (01/29/2011); COLONIC POLYPS, HX OF (08/29/2010); HYPERTENSION (08/29/2010); Low back pain (05/27/2011); MACULAR DEGENERATION (08/29/2010); PARESTHESIA (01/29/2011); THORACIC/LUMBOSACRAL NEURITIS/RADICULITIS UNSPEC (02/28/2011); and Hyperlipidemia. and presents today for a follow up office visit.    1.) Elevated TSH - Recently found to have an elevated TSH during routine blood work. She has no history of hypothyroidism. Denies symptoms of cold/heat intolerance, fatigue or changes to skin, hair or nails. This is the second elevated TSH.   Lab Results  Component Value Date   TSH 2.49 02/19/2016    2.) Elevated cholesterol -  Recently found in to have elevated cholesterol with a 10 year CAD risk of 9%. She is not currently maintained on medication. She is taking severl   Lab Results  Component Value Date   CHOL 216* 02/01/2016   HDL 45.40 02/01/2016   LDLCALC 146* 02/01/2016   LDLDIRECT 162.2 10/19/2012   TRIG 127.0 02/01/2016   CHOLHDL 5 02/01/2016    Allergies  Allergen Reactions  . Amoxicillin   . Azithromycin   . Hydrocodone   . Lidocaine Palpitations     Current Outpatient Prescriptions on File Prior to Visit  Medication Sig Dispense Refill  . albuterol (PROVENTIL HFA;VENTOLIN HFA) 108 (90 BASE) MCG/ACT inhaler Inhale 2 puffs into the lungs every 6 (six) hours as needed for wheezing or shortness of breath. 1 Inhaler 11  . ALPRAZolam (XANAX) 0.25 MG tablet Take 1 tablet (0.25 mg total) by mouth daily as needed for anxiety. 30 tablet 0  . Multiple  Vitamins-Minerals (EYE VITAMINS PO) Take by mouth daily.    . NONFORMULARY OR COMPOUNDED ITEM Allergy Vaccine 1:50 Given at [facility]    . VITAMIN D, CHOLECALCIFEROL, PO Take by mouth daily.     No current facility-administered medications on file prior to visit.    Review of Systems  Constitutional: Negative for fever and chills.  Respiratory: Negative for chest tightness and shortness of breath.   Cardiovascular: Negative for chest pain, palpitations and leg swelling.  Endocrine: Negative for cold intolerance and heat intolerance.      Objective:    BP 134/76 mmHg  Pulse 80  Temp(Src) 98.3 F (36.8 C) (Oral)  Resp 16  Ht 5\' 2"  (1.575 m)  Wt 132 lb (59.875 kg)  BMI 24.14 kg/m2  SpO2 95% Nursing note and vital signs reviewed.  Physical Exam  Constitutional: She is oriented to person, place, and time. She appears well-developed and well-nourished. No distress.  Cardiovascular: Normal rate, regular rhythm, normal heart sounds and intact distal pulses.   Pulmonary/Chest: Effort normal and breath sounds normal.  Neurological: She is alert and oriented to person, place, and time.  Skin: Skin is warm and dry.  Psychiatric: She has a normal mood and affect. Her behavior is normal. Judgment and thought content normal.       Assessment & Plan:   Problem List Items Addressed This Visit      Other   Elevated TSH - Primary    Elevated TSH on most recent lab  work. Forensic scientist TSH and T4. Obtain ultrasound pending lab work. Consider therapy if TSH remains elevated.       Relevant Orders   US Soft Tissue Head/Neck   TSH (Completed)   T4   Hyperlipidemia    Blood work consistent with hyperlipidemia with CAD risk factor of 9% in the next 10 years. Qualifies for statin therapy. Start pravastatin. Risk and side effects discussed. Recommend a low fat, decreased processed/sugary foods and continued physical activity. Follow up in 3 months.       Relevant Medications    pravastatin (PRAVACHOL) 20 MG tablet

## 2016-02-21 ENCOUNTER — Encounter: Payer: Self-pay | Admitting: Internal Medicine

## 2016-02-21 ENCOUNTER — Telehealth: Payer: Self-pay | Admitting: Internal Medicine

## 2016-02-21 DIAGNOSIS — J309 Allergic rhinitis, unspecified: Secondary | ICD-10-CM | POA: Diagnosis not present

## 2016-02-21 NOTE — Telephone Encounter (Signed)
Allergy Serum Extract Date Mixed: 02/21/16 Vial: 2 Strength: 1:50 Here/Mail/Pick Up: here Mixed By: tbs Last OV: 10/30/14 Pending OV: n/a

## 2016-02-22 ENCOUNTER — Ambulatory Visit
Admission: RE | Admit: 2016-02-22 | Discharge: 2016-02-22 | Disposition: A | Discharge status: Home / Self Care | Payer: BLUE CROSS/BLUE SHIELD | Source: Ambulatory Visit | Attending: Family | Admitting: Family

## 2016-02-22 DIAGNOSIS — R7989 Other specified abnormal findings of blood chemistry: Secondary | ICD-10-CM

## 2016-02-22 MED ORDER — LEVOTHYROXINE SODIUM 25 MCG PO TABS: 25.0000 ug | ORAL_TABLET | Freq: Every day | ORAL | Status: DC

## 2016-02-22 NOTE — Telephone Encounter (Signed)
Pt is interested in trying the thyroid medication. Will you please send in the medication as soon as you can.

## 2016-02-22 NOTE — Telephone Encounter (Signed)
Medication sent.

## 2016-02-24 NOTE — Telephone Encounter (Signed)
Please inform patient that her thyroid ultrasound was normal. Therefore no further imaging is needed at this time. Please start the thyroid medication and was sent to pharmacy. We will follow-up in 6 weeks.

## 2016-02-25 NOTE — Telephone Encounter (Signed)
Pt aware of results. Pt is aware to get TSH rechecked in 6 weeks.

## 2016-02-26 ENCOUNTER — Ambulatory Visit (INDEPENDENT_AMBULATORY_CARE_PROVIDER_SITE_OTHER): Payer: BLUE CROSS/BLUE SHIELD | Admitting: *Deleted

## 2016-02-26 DIAGNOSIS — J309 Allergic rhinitis, unspecified: Secondary | ICD-10-CM | POA: Diagnosis not present

## 2016-03-04 ENCOUNTER — Encounter: Payer: Self-pay | Admitting: Internal Medicine

## 2016-03-04 ENCOUNTER — Ambulatory Visit (INDEPENDENT_AMBULATORY_CARE_PROVIDER_SITE_OTHER): Payer: BLUE CROSS/BLUE SHIELD | Admitting: Internal Medicine

## 2016-03-04 ENCOUNTER — Ambulatory Visit (INDEPENDENT_AMBULATORY_CARE_PROVIDER_SITE_OTHER): Payer: BLUE CROSS/BLUE SHIELD | Admitting: *Deleted

## 2016-03-04 VITALS — BP 116/70 | HR 75 | Ht 62.0 in | Wt 134.8 lb

## 2016-03-04 DIAGNOSIS — J302 Other seasonal allergic rhinitis: Secondary | ICD-10-CM

## 2016-03-04 DIAGNOSIS — J309 Allergic rhinitis, unspecified: Secondary | ICD-10-CM

## 2016-03-04 DIAGNOSIS — J4520 Mild intermittent asthma, uncomplicated: Secondary | ICD-10-CM

## 2016-03-04 DIAGNOSIS — J3089 Other allergic rhinitis: Principal | ICD-10-CM

## 2016-03-04 NOTE — Assessment & Plan Note (Signed)
Well-controlled, mild intermittent without recent exacerbation.

## 2016-03-04 NOTE — Assessment & Plan Note (Signed)
Mild seasonal exacerbation. Plan-advance allergy vaccine to 1:10 with next order. Use OTC Flonase and antihistamine as needed.

## 2016-03-04 NOTE — Progress Notes (Signed)
06/20/14- 6 yoF never smoker Self Referral-having trouble with cat and house dust(noticed when cleaning)-causes breathing issues. Usually has seasonal allergies (Spring and Fall). States allergies dx'd 40 yrs ago. Allergy skin testing followed by 2 years of allergy shots 10 yrs ago. Allergy vaccine seemed helpful first year, but then symptoms seemed no better next yea.  Spring and Fall sneeze, itching eyes, rhinorhea all worse in the last month this year. Some short of breath and wheeze helped by SABA rescyue inhaler used about twice/ year.  Triggers- Seasonal pollens, perennial exposure to dust, cats. Food- kiwi fruit- can make throat itch. No rash.  No hx aspirin intolerance, nasal polyps.  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 Blames this for some symptoms. Meds- prefers Xyzal over otc antihistamine   07/26/14- 57 yoF never smoker followed for allergic rhinitis, asthma, rash FOLLOWS FOR: has noticed that she recently having a rash come up after going to pool or in showers. She has some spots on her today to show. New rash seems to develop a few days after she was here last. No change in medications and no recognized food triggers. It is more visible after shower or at swimming pool. She had stopped xyzal in the spring. Cats make her short of breath. Nasal congestion in the spring and fall. Allergy profile 06/20/14-Total IgE 1829.1 with marked elevations for all specific allergens tested, especially high for dust mite. Elevated also for all foods tested Food Allergy Profile 06/20/14  08/30/14- 70 yoF never smoker followed for allergic rhinitis, Hyper IgE, asthma, rash No antihistamines, OTC cough syrups, or OTC sleep aids in past 3 days- coming for allergy skin testing Complains of itching watery nose, worst in the fall. Testing in her home and exposure to cats make her short of breath but she does not wheeze  much IgE 07/26/14- total 1313, was 1829 previously Allergy skin testing 08/30/14- Strongly positive to most pollens and endodermals- not dog or molds Only puncture tests done. She wants to try allergy vaccine again  03/04/2016-60 year old female never smoker followed for allergic rhinitis, hyper IgE/ eosinophilia, asthma, rash Allergy vaccine GH> 1:10 FOLLOWS FOR: Still on allergy vaccine and no reactions.  She has had a few small local reactions and she builds vaccine again, now at 1:50 . Recently more nasal congestion, itching, burning, postnasal drip. She was expecting "cure" despite her previous experience. I reviewed goals, realistic expectations and therapeutic options. Suggested Flonase. She also had some misunderstanding about her insurance coverage and we are helping her with that.  ROS-see HPI Constitutional:   No-   weight loss, night sweats, fevers, chills, fatigue, lassitude. HEENT:   No-  headaches, difficulty swallowing, tooth/dental problems, sore throat,       + sneezing, +itching, ear ache, +nasal congestion, +post nasal drip,  CV:  No-   chest pain, orthopnea, PND, swelling in lower extremities, anasarca,                                                        dizziness, palpitations Resp: No-   shortness of breath with exertion or at rest.              No-   productive cough,  No non-productive cough,  No- coughing up of  blood.              No-   change in color of mucus.  No- wheezing.   Skin: +new GI:  +heartburn, indigestion, abdominal pain, nausea, vomiting,  GU:  MS:  + joint pain or swellin Neuro-     nothing unusual Psych:  No- change in mood or affect. No depression or anxiety.  No memory loss.  OBJ- Physical Exam General- Alert, Oriented, Affect-appropriate, Distress- none acute Skin- +faint macular rash on the volar wrists without excoriation Lymphadenopathy- none Head- atraumatic            Eyes- Gross vision intact, PERRLA, conjunctivae and secretions  clear            Ears- Hearing, canals-normal            Nose- + turbinate edema, no-Septal dev, mucus, polyps, erosion, perforation             Throat- Mallampati III-IV , mucosa clear , drainage- none, tonsils- atrophic Neck- flexible , trachea midline, no stridor , thyroid nl, carotid no bruit Chest - symmetrical excursion , unlabored           Heart/CV- RRR , no murmur , no gallop  , no rub, nl s1 s2                           - JVD- none , edema- none, stasis changes- none, varices- none           Lung- clear to P&A, wheeze- none, cough- none , dullness-none, rub- none           Chest wall-  Abd-  Br/ Gen/ Rectal- Not done, not indicated Extrem- cyanosis- none, clubbing, none, atrophy- none, strength- nl Neuro- grossly intact to observation

## 2016-03-04 NOTE — Patient Instructions (Addendum)
We can advance allergy vaccine to 1:10 with next order. I will notify allergy lab.  Suggest otc Flonase/ fluticasone nasal spray for now while allergy is bothering your nose more.  1-2 puffs in each nostril once daily at bedtime.

## 2016-03-11 ENCOUNTER — Ambulatory Visit (INDEPENDENT_AMBULATORY_CARE_PROVIDER_SITE_OTHER): Payer: BLUE CROSS/BLUE SHIELD

## 2016-03-11 DIAGNOSIS — J309 Allergic rhinitis, unspecified: Secondary | ICD-10-CM | POA: Diagnosis not present

## 2016-03-18 ENCOUNTER — Ambulatory Visit: Payer: BLUE CROSS/BLUE SHIELD

## 2016-03-18 ENCOUNTER — Telehealth: Payer: Self-pay | Admitting: Internal Medicine

## 2016-03-18 NOTE — Telephone Encounter (Signed)
If she feels this is a cold/ infection, then suggest she skip allergy shot this week. If she thinks it is her seasonal allergy, then ok to get shot.

## 2016-03-18 NOTE — Telephone Encounter (Signed)
Called and spoke to pt. Informed pt of the recs per CY. Pt verbalized understanding and denied any further questions or concerns at this time.  

## 2016-03-18 NOTE — Telephone Encounter (Signed)
Spoke with the pt  She is c/o cough with yellow sputum, sneezing and rhinitis for the past 3 days  She is asking if she should get her allergy vaccine or not due to having these cold symptoms  Please advise, thanks  Allergies  Allergen Reactions  . Amoxicillin   . Azithromycin   . Hydrocodone   . Lidocaine Palpitations   Current Outpatient Prescriptions on File Prior to Visit  Medication Sig Dispense Refill  . albuterol (PROVENTIL HFA;VENTOLIN HFA) 108 (90 BASE) MCG/ACT inhaler Inhale 2 puffs into the lungs every 6 (six) hours as needed for wheezing or shortness of breath. 1 Inhaler 11  . ALPHA-LIPOIC ACID PO Take by mouth.    . ASTAXANTHIN PO Take by mouth. With DHA    . BENFOTIAMINE PO Take 250 mg by mouth.    Marland Kitchen CINNAMON PO Take by mouth. Cinnamon27    . Iodine 2 % TINC Apply topically. Nascent Iodine 2%    . KRILL OIL PO Take by mouth. [employer]    . levocetirizine (XYZAL) 5 MG tablet Take 1 tablet by mouth daily.  0  . levothyroxine (LEVOTHROID) 25 MCG tablet Take 1 tablet (25 mcg total) by mouth daily before breakfast. 30 tablet 2  . MAGNESIUM PO Take by mouth. [employer] Magnesium Oil spray    . NONFORMULARY OR COMPOUNDED ITEM Allergy Vaccine 1:50 Given at [facility]    . pravastatin (PRAVACHOL) 20 MG tablet Take 1 tablet (20 mg total) by mouth daily. 30 tablet 0  . Probiotic Product (PROBIOTIC DAILY PO) Take by mouth. Soilbased Probiotic: Prescript-Assist brand    . UNABLE TO FIND Med Name: MaxiFocus Sublingual Liposomal drops    . UNABLE TO FIND Med Name: Vonzella Nipple    . UNABLE TO FIND Med Name: Taurine Spray    . UNABLE TO FIND Med Name: ActiveLife    . UNABLE TO FIND Med Name: B4 Health Spray    . UNABLE TO FIND Med Name: Joint and Skin Matrix    . UNABLE TO FIND Med Name: Pancreas + Support Spray: Glyco Spray    . UNABLE TO FIND Med Name: [id number]    . Vitamin D-Vitamin K (VITAMIN K2-VITAMIN D3 PO) Take by mouth. Vitamin D3 -Vitamin K-2 Sublingual spray      No current facility-administered medications on file prior to visit.

## 2016-03-19 ENCOUNTER — Ambulatory Visit (INDEPENDENT_AMBULATORY_CARE_PROVIDER_SITE_OTHER): Payer: BLUE CROSS/BLUE SHIELD | Admitting: Nurse Practitioner

## 2016-03-19 VITALS — BP 122/72 | HR 87 | Temp 98.7°F | Ht 62.0 in | Wt 134.0 lb

## 2016-03-19 DIAGNOSIS — R6889 Other general symptoms and signs: Secondary | ICD-10-CM

## 2016-03-19 LAB — POCT INFLUENZA A/B
Influenza A, POC: NEGATIVE
Influenza B, POC: NEGATIVE

## 2016-03-19 MED ORDER — BENZONATATE 100 MG PO CAPS: 100.0000 mg | ORAL_CAPSULE | Freq: Two times a day (BID) | ORAL | Status: DC | PRN

## 2016-03-19 MED ORDER — DOXYCYCLINE HYCLATE 100 MG PO TABS: 100.0000 mg | ORAL_TABLET | Freq: Two times a day (BID) | ORAL | Status: DC

## 2016-03-19 NOTE — Progress Notes (Signed)
Pre visit review using our clinic review tool, if applicable. No additional management support is needed unless otherwise documented below in the visit note. 

## 2016-03-19 NOTE — Patient Instructions (Signed)
Upper Respiratory Infection, Adult Most upper respiratory infections (URIs) are a viral infection of the air passages leading to the lungs. A URI affects the nose, throat, and upper air passages. The most common type of URI is nasopharyngitis and is typically referred to as "the common cold." URIs run their course and usually go away on their own. Most of the time, a URI does not require medical attention, but sometimes a bacterial infection in the upper airways can follow a viral infection. This is called a secondary infection. Sinus and middle ear infections are common types of secondary upper respiratory infections. Bacterial pneumonia can also complicate a URI. A URI can worsen asthma and chronic obstructive pulmonary disease (COPD). Sometimes, these complications can require emergency medical care and may be life threatening.  CAUSES Almost all URIs are caused by viruses. A virus is a type of germ and can spread from one person to another.  RISKS FACTORS You may be at risk for a URI if:   You smoke.   You have chronic heart or lung disease.  You have a weakened defense (immune) system.   You are very young or very old.   You have nasal allergies or asthma.  You work in crowded or poorly ventilated areas.  You work in health care facilities or schools. SIGNS AND SYMPTOMS  Symptoms typically develop 2-3 days after you come in contact with a cold virus. Most viral URIs last 7-10 days. However, viral URIs from the influenza virus (flu virus) can last 14-18 days and are typically more severe. Symptoms may include:   Runny or stuffy (congested) nose.   Sneezing.   Cough.   Sore throat.   Headache.   Fatigue.   Fever.   Loss of appetite.   Pain in your forehead, behind your eyes, and over your cheekbones (sinus pain).  Muscle aches.  DIAGNOSIS  Your health care provider may diagnose a URI by:  Physical exam.  Tests to check that your symptoms are not due to  another condition such as:  Strep throat.  Sinusitis.  Pneumonia.  Asthma. TREATMENT  A URI goes away on its own with time. It cannot be cured with medicines, but medicines may be prescribed or recommended to relieve symptoms. Medicines may help:  Reduce your fever.  Reduce your cough.  Relieve nasal congestion. HOME CARE INSTRUCTIONS   Take medicines only as directed by your health care provider.   Gargle warm saltwater or take cough drops to comfort your throat as directed by your health care provider.  Use a warm mist humidifier or inhale steam from a shower to increase air moisture. This may make it easier to breathe.  Drink enough fluid to keep your urine clear or pale yellow.   Eat soups and other clear broths and maintain good nutrition.   Rest as needed.   Return to work when your temperature has returned to normal or as your health care provider advises. You may need to stay home longer to avoid infecting others. You can also use a face mask and careful hand washing to prevent spread of the virus.  Increase the usage of your inhaler if you have asthma.   Do not use any tobacco products, including cigarettes, chewing tobacco, or electronic cigarettes. If you need help quitting, ask your health care provider. PREVENTION  The best way to protect yourself from getting a cold is to practice good hygiene.   Avoid oral or hand contact with people with cold   symptoms.   Wash your hands often if contact occurs.  There is no clear evidence that vitamin C, vitamin E, echinacea, or exercise reduces the chance of developing a cold. However, it is always recommended to get plenty of rest, exercise, and practice good nutrition.  SEEK MEDICAL CARE IF:   You are getting worse rather than better.   Your symptoms are not controlled by medicine.   You have chills.  You have worsening shortness of breath.  You have brown or red mucus.  You have yellow or brown nasal  discharge.  You have pain in your face, especially when you bend forward.  You have a fever.  You have swollen neck glands.  You have pain while swallowing.  You have white areas in the back of your throat. SEEK IMMEDIATE MEDICAL CARE IF:   You have severe or persistent:  Headache.  Ear pain.  Sinus pain.  Chest pain.  You have chronic lung disease and any of the following:  Wheezing.  Prolonged cough.  Coughing up blood.  A change in your usual mucus.  You have a stiff neck.  You have changes in your:  Vision.  Hearing.  Thinking.  Mood. MAKE SURE YOU:   Understand these instructions.  Will watch your condition.  Will get help right away if you are not doing well or get worse.   This information is not intended to replace advice given to you by your health care provider. Make sure you discuss any questions you have with your health care provider.   Document Released: 06/10/2001 Document Revised: 05/01/2015 Document Reviewed: 03/22/2014 Elsevier Interactive Patient Education 2016 [employer] Inc.  

## 2016-03-19 NOTE — Progress Notes (Signed)
Patient ID: Sierra English, female    DOB: May 30, 1956  Age: 60 y.o. MRN: [id number]  CC: OTHER   HPI Sierra English presents for CC of cough x 2-3 days   1) Husband positive for flu 4 days ago  Denies Sierra English has had a fever  Chills/sweats occasionally, ST, myalgias, cough- yellow Rhinorrhea Sinus pressure   Treatment to date: None  Sick contacts- Husband  History Sierra English has a past medical history of ALLERGIC RHINITIS (08/29/2010); ANXIETY (08/29/2010); CHEILITIS, ANGULAR (08/29/2010); CHEST PAIN (01/29/2011); COLONIC POLYPS, HX OF (08/29/2010); HYPERTENSION (08/29/2010); Low back pain (05/27/2011); MACULAR DEGENERATION (08/29/2010); PARESTHESIA (01/29/2011); THORACIC/LUMBOSACRAL NEURITIS/RADICULITIS UNSPEC (02/28/2011); and Hyperlipidemia.   Sierra English has past surgical history that includes Cholecystectomy.   Her family history includes Arthritis in her mother; Breast cancer (age of onset: 71) in her sister; Heart disease in her sister; Hypertension in her father; Lung cancer in her father.Sierra English reports that Sierra English has never smoked. Sierra English has never used smokeless tobacco. Sierra English reports that Sierra English does not drink alcohol or use illicit drugs.  Outpatient Prescriptions Prior to Visit  Medication Sig Dispense Refill  . ALPHA-LIPOIC ACID PO Take by mouth.    . ASTAXANTHIN PO Take by mouth. With DHA    . BENFOTIAMINE PO Take 250 mg by mouth.    Marland Kitchen CINNAMON PO Take by mouth. Cinnamon27    . Iodine 2 % TINC Apply topically. Nascent Iodine 2%    . KRILL OIL PO Take by mouth. [employer]    . MAGNESIUM PO Take by mouth. [employer] Magnesium Oil spray    . NONFORMULARY OR COMPOUNDED ITEM Allergy Vaccine 1:50 Given at [facility]    . pravastatin (PRAVACHOL) 20 MG tablet Take 1 tablet (20 mg total) by mouth daily. 30 tablet 0  . Probiotic Product (PROBIOTIC DAILY PO) Take by mouth. Soilbased Probiotic: Prescript-Assist brand    . UNABLE TO FIND Med Name: MaxiFocus Sublingual Liposomal drops    .  UNABLE TO FIND Med Name: Vonzella Nipple    . UNABLE TO FIND Med Name: Taurine Spray    . UNABLE TO FIND Med Name: ActiveLife    . UNABLE TO FIND Med Name: B4 Health Spray    . UNABLE TO FIND Med Name: Joint and Skin Matrix    . UNABLE TO FIND Med Name: Pancreas + Support Spray: Glyco Spray    . UNABLE TO FIND Med Name: [id number]    . Vitamin D-Vitamin K (VITAMIN K2-VITAMIN D3 PO) Take by mouth. Vitamin D3 -Vitamin K-2 Sublingual spray    . levocetirizine (XYZAL) 5 MG tablet Take 1 tablet by mouth daily. Reported on 03/19/2016  0  . albuterol (PROVENTIL HFA;VENTOLIN HFA) 108 (90 BASE) MCG/ACT inhaler Inhale 2 puffs into the lungs every 6 (six) hours as needed for wheezing or shortness of breath. (Patient not taking: Reported on 03/19/2016) 1 Inhaler 11  . levothyroxine (LEVOTHROID) 25 MCG tablet Take 1 tablet (25 mcg total) by mouth daily before breakfast. (Patient not taking: Reported on 03/19/2016) 30 tablet 2   No facility-administered medications prior to visit.    ROS Review of Systems  Constitutional: Positive for chills, diaphoresis and fatigue. Negative for fever.  HENT: Positive for congestion, postnasal drip, rhinorrhea, sinus pressure and sore throat. Negative for ear discharge, ear pain, sneezing, trouble swallowing and voice change.   Respiratory: Positive for cough. Negative for chest tightness, shortness of breath and wheezing.   Cardiovascular: Negative for chest pain, palpitations and leg swelling.  Gastrointestinal: Negative for  nausea, vomiting and diarrhea.  Musculoskeletal: Positive for myalgias.  Skin: Negative for rash.  Neurological: Negative for dizziness and headaches.    Objective:  BP 122/72 mmHg  Pulse 87  Temp(Src) 98.7 F (37.1 C) (Oral)  Ht 5\' 2"  (1.575 m)  Wt 134 lb (60.782 kg)  BMI 24.50 kg/m2  SpO2 98%  Physical Exam  Constitutional: Sierra English is oriented to person, place, and time. Sierra English appears well-developed and well-nourished. No distress.  HENT:  Head:  Normocephalic and atraumatic.  Right Ear: External ear normal.  Left Ear: External ear normal.  Mouth/Throat: Oropharynx is clear and moist. No oropharyngeal exudate.  TMs clear bilaterally  Eyes: EOM are normal. Pupils are equal, round, and reactive to light. Right eye exhibits no discharge. Left eye exhibits no discharge. No scleral icterus.  Neck: Normal range of motion. Neck supple.  Cardiovascular: Normal rate and regular rhythm.   Pulmonary/Chest: Effort normal and breath sounds normal. No respiratory distress. Sierra English has no wheezes. Sierra English has no rales. Sierra English exhibits no tenderness.  Lymphadenopathy:    Sierra English has cervical adenopathy.  Neurological: Sierra English is alert and oriented to person, place, and time. No cranial nerve deficit. Sierra English exhibits normal muscle tone. Coordination normal.  Skin: Skin is warm and dry. No rash noted. Sierra English is not diaphoretic.  Psychiatric: Sierra English has a normal mood and affect. Her behavior is normal. Judgment and thought content normal.   Assessment & Plan:   Sierra English was seen today for other and other.  Diagnoses and all orders for this visit:  Flu-like symptoms -     POCT Influenza A/B  Other orders -     Discontinue: benzonatate (TESSALON) 100 MG capsule; Take 1 capsule (100 mg total) by mouth 2 (two) times daily as needed for cough. -     Discontinue: doxycycline (VIBRA-TABS) 100 MG tablet; Take 1 tablet (100 mg total) by mouth 2 (two) times daily. -     benzonatate (TESSALON) 100 MG capsule; Take 1 capsule (100 mg total) by mouth 2 (two) times daily as needed for cough. -     doxycycline (VIBRA-TABS) 100 MG tablet; Take 1 tablet (100 mg total) by mouth 2 (two) times daily.   I have discontinued Sierra English's albuterol and levothyroxine. I am also having her maintain her NONFORMULARY OR COMPOUNDED ITEM, pravastatin, levocetirizine, UNABLE TO FIND, UNABLE TO FIND, UNABLE TO FIND, KRILL OIL PO, ASTAXANTHIN PO, UNABLE TO FIND, ALPHA-LIPOIC ACID PO, MAGNESIUM PO,  BENFOTIAMINE PO, UNABLE TO FIND, CINNAMON PO, UNABLE TO FIND, UNABLE TO FIND, Vitamin D-Vitamin K (VITAMIN K2-VITAMIN D3 PO), Iodine, Probiotic Product (PROBIOTIC DAILY PO), UNABLE TO FIND, benzonatate, and doxycycline.  Meds ordered this encounter  Medications  . DISCONTD: benzonatate (TESSALON) 100 MG capsule    Sig: Take 1 capsule (100 mg total) by mouth 2 (two) times daily as needed for cough.    Dispense:  20 capsule    Refill:  0    Order Specific Question:  Supervising Provider    Answer:  Deborra Medina L [[id number]]  . DISCONTD: doxycycline (VIBRA-TABS) 100 MG tablet    Sig: Take 1 tablet (100 mg total) by mouth 2 (two) times daily.    Dispense:  14 tablet    Refill:  0    Order Specific Question:  Supervising Provider    Answer:  Deborra Medina L [[id number]]  . benzonatate (TESSALON) 100 MG capsule    Sig: Take 1 capsule (100 mg total) by mouth 2 (two) times daily as needed  for cough.    Dispense:  20 capsule    Refill:  0    Order Specific Question:  Supervising Provider    Answer:  Deborra Medina L [[id number]]  . doxycycline (VIBRA-TABS) 100 MG tablet    Sig: Take 1 tablet (100 mg total) by mouth 2 (two) times daily.    Dispense:  14 tablet    Refill:  0    Order Specific Question:  Supervising Provider    Answer:  Crecencio Mc [[id number]]     Follow-up: Return if symptoms worsen or fail to improve.

## 2016-03-23 ENCOUNTER — Encounter: Payer: Self-pay | Admitting: Nurse Practitioner

## 2016-03-23 DIAGNOSIS — R6889 Other general symptoms and signs: Secondary | ICD-10-CM | POA: Insufficient documentation

## 2016-03-23 NOTE — Assessment & Plan Note (Signed)
Likely viral uri w/ cough, exposure to flu recently  Negative POCT influenza Tessalon perles for cough Doxycyline sent to pharmacy for cough w/ yellow sputum and cervical adenopathy on exam.  Encouraged probiotics FU prn worsening/failure to improve.

## 2016-03-25 ENCOUNTER — Ambulatory Visit (INDEPENDENT_AMBULATORY_CARE_PROVIDER_SITE_OTHER): Payer: BLUE CROSS/BLUE SHIELD | Admitting: *Deleted

## 2016-03-25 DIAGNOSIS — J309 Allergic rhinitis, unspecified: Secondary | ICD-10-CM

## 2016-04-01 ENCOUNTER — Ambulatory Visit (INDEPENDENT_AMBULATORY_CARE_PROVIDER_SITE_OTHER): Payer: BLUE CROSS/BLUE SHIELD | Admitting: *Deleted

## 2016-04-01 DIAGNOSIS — J309 Allergic rhinitis, unspecified: Secondary | ICD-10-CM | POA: Diagnosis not present

## 2016-04-02 ENCOUNTER — Telehealth: Payer: Self-pay | Admitting: Family

## 2016-04-02 ENCOUNTER — Encounter: Payer: Self-pay | Admitting: Family

## 2016-04-02 ENCOUNTER — Ambulatory Visit (INDEPENDENT_AMBULATORY_CARE_PROVIDER_SITE_OTHER): Payer: BLUE CROSS/BLUE SHIELD | Admitting: Family

## 2016-04-02 ENCOUNTER — Other Ambulatory Visit (INDEPENDENT_AMBULATORY_CARE_PROVIDER_SITE_OTHER): Payer: BLUE CROSS/BLUE SHIELD

## 2016-04-02 ENCOUNTER — Ambulatory Visit: Payer: BLUE CROSS/BLUE SHIELD

## 2016-04-02 VITALS — BP 124/80 | HR 77 | Temp 98.0°F | Resp 16 | Ht 62.0 in | Wt 133.8 lb

## 2016-04-02 DIAGNOSIS — Z5181 Encounter for therapeutic drug level monitoring: Secondary | ICD-10-CM | POA: Diagnosis not present

## 2016-04-02 DIAGNOSIS — R7989 Other specified abnormal findings of blood chemistry: Secondary | ICD-10-CM

## 2016-04-02 DIAGNOSIS — R946 Abnormal results of thyroid function studies: Secondary | ICD-10-CM | POA: Diagnosis not present

## 2016-04-02 DIAGNOSIS — R519 Headache, unspecified: Secondary | ICD-10-CM | POA: Insufficient documentation

## 2016-04-02 DIAGNOSIS — R7302 Impaired glucose tolerance (oral): Secondary | ICD-10-CM | POA: Diagnosis not present

## 2016-04-02 DIAGNOSIS — L819 Disorder of pigmentation, unspecified: Secondary | ICD-10-CM

## 2016-04-02 DIAGNOSIS — R51 Headache: Secondary | ICD-10-CM

## 2016-04-02 DIAGNOSIS — E119 Type 2 diabetes mellitus without complications: Secondary | ICD-10-CM | POA: Insufficient documentation

## 2016-04-02 DIAGNOSIS — D229 Melanocytic nevi, unspecified: Secondary | ICD-10-CM

## 2016-04-02 LAB — TSH: TSH: 2.26 u[IU]/mL (ref 0.35–4.50)

## 2016-04-02 LAB — HEPATIC FUNCTION PANEL
ALBUMIN: 4.4 g/dL (ref 3.5–5.2)
ALT: 23 U/L (ref 0–35)
AST: 19 U/L (ref 0–37)
Alkaline Phosphatase: 84 U/L (ref 39–117)
BILIRUBIN DIRECT: 0.2 mg/dL (ref 0.0–0.3)
TOTAL PROTEIN: 7.3 g/dL (ref 6.0–8.3)
Total Bilirubin: 1.2 mg/dL (ref 0.2–1.2)

## 2016-04-02 LAB — HEMOGLOBIN A1C: Hgb A1c MFr Bld: 6.6 % — ABNORMAL HIGH (ref 4.6–6.5)

## 2016-04-02 MED ORDER — DOXYCYCLINE HYCLATE 100 MG PO TABS: 100.0000 mg | ORAL_TABLET | Freq: Two times a day (BID) | ORAL | Status: DC

## 2016-04-02 MED ORDER — DOXYCYCLINE HYCLATE 100 MG PO TABS: 100.0000 mg | ORAL_TABLET | Freq: Two times a day (BID) | ORAL | Status: AC

## 2016-04-02 MED ORDER — METFORMIN HCL ER 500 MG PO TB24: 500.0000 mg | ORAL_TABLET | Freq: Every day | ORAL | Status: DC

## 2016-04-02 NOTE — Assessment & Plan Note (Signed)
Previously noted to have elevated TSH and normal thyroid ultrasound. Obtain TSH, T3, and T4 for confirmation. Follow-up pending blood work results and additional treatment if necessary.

## 2016-04-02 NOTE — Assessment & Plan Note (Signed)
Symptoms and exam consistent with sinus headaches and increased sinus pressure most likely related to congestion. Cannot rule out underlying sinus infection. Recommend completion of previously prescribed doxycycline to cover for sinus infection. Over-the-counter medications including second-generation antihistamines and nasal corticosteroids for symptom relief and supportive care. Follow-up if symptoms worsen or fail to improve.

## 2016-04-02 NOTE — Assessment & Plan Note (Signed)
Previous history of impaired fasting glucose with new A1c of 6.6 positive for Type 2 diabetes. Start metformin. Follow up in 1 month for diabetes prevention and further medication management and 3 months for A1c.

## 2016-04-02 NOTE — Patient Instructions (Addendum)
Thank you for choosing [facility].  Summary/Instructions:  Please complete the previously prescribed doxycycline.  Start Flonase 2x daily to help reduce inflammation  Please try Coricidin HBP.  If your symptoms worsen we will try some prednisone to decrease the inflammation.  Tylenol or Aleve as needed.    Your prescription(s) have been submitted to your pharmacy or been printed and provided for you. Please take as directed and contact our office if you believe you are having problem(s) with the medication(s) or have any questions.  If your symptoms worsen or fail to improve, please contact our office for further instruction, or in case of emergency go directly to the emergency room at the closest medical facility.   Sinus Headache A sinus headache occurs when the paranasal sinuses become clogged or swollen. Paranasal sinuses are air pockets within the bones of the face. Sinus headaches can range from mild to severe. CAUSES A sinus headache can result from various conditions that affect the sinuses, such as:  Colds.  Sinus infections.  Allergies. SYMPTOMS The main symptom of this condition is a headache that may feel like pain or pressure in the face, forehead, ears, or upper teeth. People who have a sinus headache often have other symptoms, such as:  Congested or runny nose.  Fever.  Inability to smell. Weather changes can make symptoms worse. DIAGNOSIS This condition may be diagnosed based on:  A physical exam and medical history.  Imaging tests, such as a CT scan and MRI, to check for problems with the sinuses.  A specialist may look into the sinuses with a tool that has a camera (endoscopy). TREATMENT Treatment for this condition depends on the cause.  Sinus pain that is caused by a sinus infection may be treated with antibiotic medicine.  Sinus pain that is caused by allergies may be helped by allergy medicines (antihistamines) and medicated nasal  sprays.  Sinus pain that is caused by congestion may be helped by flushing the nose and sinuses with saline solution. HOME CARE INSTRUCTIONS  Take medicines only as directed by your health care provider.  If you were prescribed an antibiotic medicine, finish all of it even if you start to feel better.  If you have congestion, use a nasal spray to help reduce pressure.  If directed, apply a warm, moist washcloth to your face to help relieve pain. SEEK MEDICAL CARE IF:  You have headaches more than one time each week.  You have sensitivity to light or sound.  You have a fever.  You feel sick to your stomach (nauseous) or you throw up (vomit).  Your headaches do not get better with treatment. Many people think that they have a sinus headache when they actually have migraines or tension headaches. SEEK IMMEDIATE MEDICAL CARE IF:  You have vision problems.  You have sudden, severe pain in your face or head.  You have a seizure.  You are confused.  You have a stiff neck.   This information is not intended to replace advice given to you by your health care provider. Make sure you discuss any questions you have with your health care provider.   Document Released: 01/22/2005 Document Revised: 05/01/2015 Document Reviewed: 12/11/2014 Elsevier Interactive Patient Education [employer].

## 2016-04-02 NOTE — Telephone Encounter (Signed)
Please inform patient that her thyroid function continues to be normal with no further testing needed at this time. Her liver function is normal. Lastly her A1c is elevated at 6.6 indicating the presence of Type 2 diabetes. We will send in a prescription for metformin and plan to follow up in 1 month for prevention and 3 months for A1c.

## 2016-04-02 NOTE — Assessment & Plan Note (Signed)
Previously diagnosed with impaired fasting glucose and experiencing elevated blood sugars in the morning. Obtain A1c to rule out type 2 diabetes. Follow-up pending A1c results.

## 2016-04-02 NOTE — Progress Notes (Signed)
Subjective:    Patient ID: Sierra English, female    DOB: 1956-02-17, 60 y.o.   MRN: [id number]  Chief Complaint  Patient presents with  . Headache    has been having headaches and has been feeling a little dizzy, she has some burning at the bridge of her nose, has been checking sugars and they have been around 140s-150s, headaches have been going on since the cold she was seen for on 3/22    HPI:  Sierra English is a 60 y.o. female who  has a past medical history of ALLERGIC RHINITIS (08/29/2010); ANXIETY (08/29/2010); CHEILITIS, ANGULAR (08/29/2010); CHEST PAIN (01/29/2011); COLONIC POLYPS, HX OF (08/29/2010); HYPERTENSION (08/29/2010); Low back pain (05/27/2011); MACULAR DEGENERATION (08/29/2010); PARESTHESIA (01/29/2011); THORACIC/LUMBOSACRAL NEURITIS/RADICULITIS UNSPEC (02/28/2011); and Hyperlipidemia. and presents today for a follow up office visit.   1.) Headaches - This is a new problem.  Associated symptom of headaches that have been going on for approximately one to 2 weeks following a cold that started on March 22. She is also been feeling slightly dizzy at times with what she describes as burning at the bridge of her nose. Blood sugars at home have been between 140 and 150. She was recently seen in the office for an upper respiratory infection and was treated with doxycycline. Reports taking 2-3 doses and then stopped taking the medication. Headaches are described as achy and move around with the top of the head being the most common spot. Modifying factors include Tylenol which has not helped very much. She continues to take the allergy shots on a weekly basis. No fevers.  2.) Elevated blood sugars - Previously diagnosed with With impaired fasting glucose and has noted her morning blood sugars to be elevated with an average between 130 and 150. Denies symptoms of end organ damage or polydipsia, polyphagia, or polyuria.  3.) Thyroid concerns - previously evaluated for concerns of thyroid issues  with a normal TSH and thyroid ultrasound. Requesting recheck of her thyroid function tests to ensure no further treatment is necessary at this time.  Allergies  Allergen Reactions  . Amoxicillin   . Azithromycin   . Hydrocodone   . Lidocaine Palpitations     Current Outpatient Prescriptions on File Prior to Visit  Medication Sig Dispense Refill  . ALPHA-LIPOIC ACID PO Take by mouth.    . ASTAXANTHIN PO Take by mouth. With DHA    . BENFOTIAMINE PO Take 250 mg by mouth.    Marland Kitchen CINNAMON PO Take by mouth. Cinnamon27    . Iodine 2 % TINC Apply topically. Nascent Iodine 2%    . KRILL OIL PO Take by mouth. [employer]    . levocetirizine (XYZAL) 5 MG tablet Take 1 tablet by mouth daily. Reported on 03/19/2016  0  . MAGNESIUM PO Take by mouth. [employer] Magnesium Oil spray    . NONFORMULARY OR COMPOUNDED ITEM Allergy Vaccine 1:50 Given at [facility]    . pravastatin (PRAVACHOL) 20 MG tablet Take 1 tablet (20 mg total) by mouth daily. 30 tablet 0  . Probiotic Product (PROBIOTIC DAILY PO) Take by mouth. Soilbased Probiotic: Prescript-Assist brand    . UNABLE TO FIND Med Name: MaxiFocus Sublingual Liposomal drops    . UNABLE TO FIND Med Name: Vonzella Nipple    . UNABLE TO FIND Med Name: Taurine Spray    . UNABLE TO FIND Med Name: ActiveLife    . UNABLE TO FIND Med Name: B4 Health Spray    . UNABLE  TO FIND Med Name: Joint and Skin Matrix    . UNABLE TO FIND Med Name: Pancreas + Support Spray: Glyco Spray    . UNABLE TO FIND Med Name: [id number]    . Vitamin D-Vitamin K (VITAMIN K2-VITAMIN D3 PO) Take by mouth. Vitamin D3 -Vitamin K-2 Sublingual spray     No current facility-administered medications on file prior to visit.    Past Medical History  Diagnosis Date  . ALLERGIC RHINITIS 08/29/2010  . ANXIETY 08/29/2010  . [place], [place] 08/29/2010  . CHEST PAIN 01/29/2011  . COLONIC POLYPS, HX OF 08/29/2010  . HYPERTENSION 08/29/2010  . Low back pain 05/27/2011  . MACULAR DEGENERATION  08/29/2010  . PARESTHESIA 01/29/2011  . THORACIC/LUMBOSACRAL NEURITIS/RADICULITIS UNSPEC 02/28/2011  . Hyperlipidemia      Past Surgical History  Procedure Laterality Date  . Cholecystectomy      Review of Systems  Constitutional: Negative for fever and chills.  HENT: Positive for congestion.   Respiratory: Negative for cough, chest tightness and wheezing.   Cardiovascular: Negative for chest pain.  Neurological: Positive for headaches.      Objective:    BP 124/80 mmHg  Pulse 77  Temp(Src) 98 F (36.7 C) (Oral)  Resp 16  Ht 5\' 2"  (1.575 m)  Wt 133 lb 12.8 oz (60.691 kg)  BMI 24.47 kg/m2  SpO2 99% Nursing note and vital signs reviewed.  Physical Exam  Constitutional: She is oriented to person, place, and time. She appears well-developed and well-nourished. No distress.  HENT:  Right Ear: Hearing, tympanic membrane, external ear and ear canal normal.  Left Ear: Hearing, tympanic membrane, external ear and ear canal normal.  Nose: Right sinus exhibits maxillary sinus tenderness and frontal sinus tenderness. Left sinus exhibits maxillary sinus tenderness and frontal sinus tenderness.  Mouth/Throat: Uvula is midline, oropharynx is clear and moist and mucous membranes are normal.  Neck: No thyromegaly present.  Cardiovascular: Normal rate, regular rhythm, normal heart sounds and intact distal pulses.   Pulmonary/Chest: Effort normal and breath sounds normal.  Neurological: She is alert and oriented to person, place, and time.  Skin: Skin is warm and dry.  Psychiatric: She has a normal mood and affect. Her behavior is normal. Judgment and thought content normal.       Assessment & Plan:   Problem List Items Addressed This Visit      Endocrine   Impaired glucose tolerance    Previously diagnosed with impaired fasting glucose and experiencing elevated blood sugars in the morning. Obtain A1c to rule out type 2 diabetes. Follow-up pending A1c results.      Relevant Orders    Hemoglobin A1c (Completed)     Other   Elevated TSH    Previously noted to have elevated TSH and normal thyroid ultrasound. Obtain TSH, T3, and T4 for confirmation. Follow-up pending blood work results and additional treatment if necessary.      Relevant Orders   TSH (Completed)   Hemoglobin A1c (Completed)   Sinus headache - Primary    Symptoms and exam consistent with sinus headaches and increased sinus pressure most likely related to congestion. Cannot rule out underlying sinus infection. Recommend completion of previously prescribed doxycycline to cover for sinus infection. Over-the-counter medications including second-generation antihistamines and nasal corticosteroids for symptom relief and supportive care. Follow-up if symptoms worsen or fail to improve.      Relevant Medications   doxycycline (VIBRA-TABS) 100 MG tablet    Other Visit Diagnoses    Encounter for  therapeutic drug monitoring        Relevant Orders    Hepatic function panel (Completed)        I have discontinued Ms. Grace's benzonatate. I am also having her maintain her NONFORMULARY OR COMPOUNDED ITEM, pravastatin, levocetirizine, UNABLE TO FIND, UNABLE TO FIND, UNABLE TO FIND, KRILL OIL PO, ASTAXANTHIN PO, UNABLE TO FIND, ALPHA-LIPOIC ACID PO, MAGNESIUM PO, BENFOTIAMINE PO, UNABLE TO FIND, CINNAMON PO, UNABLE TO FIND, UNABLE TO FIND, Vitamin D-Vitamin K (VITAMIN K2-VITAMIN D3 PO), Iodine, Probiotic Product (PROBIOTIC DAILY PO), UNABLE TO FIND, and doxycycline.   Meds ordered this encounter  Medications  . DISCONTD: doxycycline (VIBRA-TABS) 100 MG tablet    Sig: Take 1 tablet (100 mg total) by mouth 2 (two) times daily.    Dispense:  11 tablet    Refill:  0    Order Specific Question:  Supervising Provider    Answer:  Pricilla Holm A [id number]  . doxycycline (VIBRA-TABS) 100 MG tablet    Sig: Take 1 tablet (100 mg total) by mouth 2 (two) times daily.    Dispense:  11 tablet    Refill:  0    Order  Specific Question:  Supervising Provider    Answer:  Pricilla Holm A [id number]     Follow-up: Return for Pending blood work results or sooner if needed. Mauricio Po, FNP

## 2016-04-03 ENCOUNTER — Ambulatory Visit: Payer: BLUE CROSS/BLUE SHIELD

## 2016-04-04 ENCOUNTER — Ambulatory Visit: Payer: BLUE CROSS/BLUE SHIELD

## 2016-04-04 NOTE — Telephone Encounter (Signed)
Pt aware of results 

## 2016-04-07 ENCOUNTER — Ambulatory Visit: Payer: BLUE CROSS/BLUE SHIELD

## 2016-04-07 ENCOUNTER — Telehealth: Payer: Self-pay | Admitting: Family

## 2016-04-07 NOTE — Telephone Encounter (Signed)
Can you please give pt a call regarding her cholesterol medication.

## 2016-04-08 ENCOUNTER — Ambulatory Visit (INDEPENDENT_AMBULATORY_CARE_PROVIDER_SITE_OTHER): Payer: BLUE CROSS/BLUE SHIELD | Admitting: *Deleted

## 2016-04-08 ENCOUNTER — Ambulatory Visit: Payer: BLUE CROSS/BLUE SHIELD

## 2016-04-08 DIAGNOSIS — J309 Allergic rhinitis, unspecified: Secondary | ICD-10-CM

## 2016-04-08 MED ORDER — EZETIMIBE 10 MG PO TABS: 10.0000 mg | ORAL_TABLET | Freq: Every day | ORAL | Status: AC

## 2016-04-08 NOTE — Telephone Encounter (Signed)
Sent to pharmacy 

## 2016-04-08 NOTE — Telephone Encounter (Signed)
Pt called again about his med.  Lovena Le can call please today

## 2016-04-08 NOTE — Telephone Encounter (Signed)
Pt is wanting to know if you can send zetia to pharmacy. Is this ok?

## 2016-04-09 ENCOUNTER — Ambulatory Visit: Payer: BLUE CROSS/BLUE SHIELD

## 2016-04-10 ENCOUNTER — Ambulatory Visit: Payer: BLUE CROSS/BLUE SHIELD

## 2016-04-11 ENCOUNTER — Ambulatory Visit: Payer: BLUE CROSS/BLUE SHIELD

## 2016-04-14 ENCOUNTER — Ambulatory Visit: Payer: BLUE CROSS/BLUE SHIELD

## 2016-04-15 ENCOUNTER — Ambulatory Visit: Payer: BLUE CROSS/BLUE SHIELD

## 2016-04-15 ENCOUNTER — Ambulatory Visit (INDEPENDENT_AMBULATORY_CARE_PROVIDER_SITE_OTHER): Payer: BLUE CROSS/BLUE SHIELD | Admitting: *Deleted

## 2016-04-15 DIAGNOSIS — J309 Allergic rhinitis, unspecified: Secondary | ICD-10-CM | POA: Diagnosis not present

## 2016-04-15 NOTE — Progress Notes (Signed)
Pt. Been given one shot since 03/25/16: 1027,04/01/16: 1003,04/08/16:0836, including today. One shot from now on.

## 2016-04-16 ENCOUNTER — Ambulatory Visit: Payer: BLUE CROSS/BLUE SHIELD

## 2016-04-17 ENCOUNTER — Ambulatory Visit: Payer: BLUE CROSS/BLUE SHIELD

## 2016-04-18 ENCOUNTER — Ambulatory Visit: Payer: BLUE CROSS/BLUE SHIELD

## 2016-04-21 ENCOUNTER — Ambulatory Visit: Payer: BLUE CROSS/BLUE SHIELD

## 2016-04-22 ENCOUNTER — Ambulatory Visit: Payer: BLUE CROSS/BLUE SHIELD

## 2016-04-22 ENCOUNTER — Ambulatory Visit (INDEPENDENT_AMBULATORY_CARE_PROVIDER_SITE_OTHER): Payer: BLUE CROSS/BLUE SHIELD | Admitting: *Deleted

## 2016-04-22 DIAGNOSIS — J309 Allergic rhinitis, unspecified: Secondary | ICD-10-CM

## 2016-04-22 NOTE — Progress Notes (Signed)
Only one shot!

## 2016-04-23 ENCOUNTER — Ambulatory Visit: Payer: BLUE CROSS/BLUE SHIELD

## 2016-04-24 ENCOUNTER — Ambulatory Visit: Payer: BLUE CROSS/BLUE SHIELD

## 2016-04-25 ENCOUNTER — Ambulatory Visit: Payer: BLUE CROSS/BLUE SHIELD

## 2016-04-28 ENCOUNTER — Ambulatory Visit: Payer: BLUE CROSS/BLUE SHIELD

## 2016-04-29 ENCOUNTER — Ambulatory Visit (INDEPENDENT_AMBULATORY_CARE_PROVIDER_SITE_OTHER): Payer: BLUE CROSS/BLUE SHIELD | Admitting: *Deleted

## 2016-04-29 ENCOUNTER — Ambulatory Visit: Payer: BLUE CROSS/BLUE SHIELD

## 2016-04-29 DIAGNOSIS — J309 Allergic rhinitis, unspecified: Secondary | ICD-10-CM | POA: Diagnosis not present

## 2016-04-30 ENCOUNTER — Telehealth: Payer: Self-pay | Admitting: *Deleted

## 2016-04-30 ENCOUNTER — Ambulatory Visit: Payer: BLUE CROSS/BLUE SHIELD

## 2016-04-30 DIAGNOSIS — E119 Type 2 diabetes mellitus without complications: Secondary | ICD-10-CM

## 2016-04-30 MED ORDER — METFORMIN HCL ER 500 MG PO TB24: 500.0000 mg | ORAL_TABLET | Freq: Every day | ORAL | Status: DC

## 2016-04-30 NOTE — Telephone Encounter (Signed)
Medication sent to pharmacy. Follow up in 2 months for A1c.

## 2016-04-30 NOTE — Telephone Encounter (Signed)
Pt left msg on triage stating she was rx Metformin 30 day supply she is wanting to know does sjhe need to come back into do labs, or does she need to continue taking med if so will need refills...Johny Chess

## 2016-05-01 ENCOUNTER — Ambulatory Visit: Payer: BLUE CROSS/BLUE SHIELD

## 2016-05-01 NOTE — Telephone Encounter (Signed)
Called pt no answer LMOM w/Greg response../lmb 

## 2016-05-02 ENCOUNTER — Ambulatory Visit: Payer: BLUE CROSS/BLUE SHIELD

## 2016-05-05 ENCOUNTER — Ambulatory Visit: Payer: BLUE CROSS/BLUE SHIELD

## 2016-05-06 ENCOUNTER — Ambulatory Visit: Payer: BLUE CROSS/BLUE SHIELD

## 2016-05-06 ENCOUNTER — Ambulatory Visit (INDEPENDENT_AMBULATORY_CARE_PROVIDER_SITE_OTHER): Payer: BLUE CROSS/BLUE SHIELD | Admitting: *Deleted

## 2016-05-06 DIAGNOSIS — J309 Allergic rhinitis, unspecified: Secondary | ICD-10-CM | POA: Diagnosis not present

## 2016-05-07 ENCOUNTER — Ambulatory Visit: Payer: BLUE CROSS/BLUE SHIELD

## 2016-05-08 ENCOUNTER — Ambulatory Visit: Payer: BLUE CROSS/BLUE SHIELD

## 2016-05-09 ENCOUNTER — Ambulatory Visit: Payer: BLUE CROSS/BLUE SHIELD

## 2016-05-12 ENCOUNTER — Ambulatory Visit: Payer: BLUE CROSS/BLUE SHIELD

## 2016-05-13 ENCOUNTER — Ambulatory Visit: Payer: BLUE CROSS/BLUE SHIELD

## 2016-05-14 ENCOUNTER — Ambulatory Visit: Payer: BLUE CROSS/BLUE SHIELD

## 2016-05-15 ENCOUNTER — Encounter: Payer: Self-pay | Admitting: Internal Medicine

## 2016-05-15 ENCOUNTER — Ambulatory Visit: Payer: BLUE CROSS/BLUE SHIELD

## 2016-05-16 ENCOUNTER — Ambulatory Visit: Payer: BLUE CROSS/BLUE SHIELD

## 2016-05-19 ENCOUNTER — Ambulatory Visit: Payer: BLUE CROSS/BLUE SHIELD

## 2016-05-20 ENCOUNTER — Ambulatory Visit: Payer: BLUE CROSS/BLUE SHIELD

## 2016-05-21 ENCOUNTER — Ambulatory Visit: Payer: BLUE CROSS/BLUE SHIELD

## 2016-05-22 ENCOUNTER — Ambulatory Visit: Payer: BLUE CROSS/BLUE SHIELD

## 2016-05-23 ENCOUNTER — Ambulatory Visit: Payer: BLUE CROSS/BLUE SHIELD

## 2016-05-27 ENCOUNTER — Ambulatory Visit: Payer: BLUE CROSS/BLUE SHIELD

## 2016-06-02 ENCOUNTER — Ambulatory Visit: Payer: BLUE CROSS/BLUE SHIELD | Admitting: Cardiology

## 2016-06-13 ENCOUNTER — Telehealth: Payer: Self-pay | Admitting: Internal Medicine

## 2016-06-13 MED ORDER — ALBUTEROL SULFATE HFA 108 (90 BASE) MCG/ACT IN AERS: 2.0000 | INHALATION_SPRAY | Freq: Four times a day (QID) | RESPIRATORY_TRACT | Status: AC | PRN

## 2016-06-13 NOTE — Telephone Encounter (Signed)
Spoke with pt. She needs a refill sent in on Proair. CY gave this to her in the past. Rx has been sent in. Nothing further was needed.

## 2016-06-16 ENCOUNTER — Telehealth: Payer: Self-pay

## 2016-06-16 MED ORDER — MIRTAZAPINE 15 MG PO TABS: 15.0000 mg | ORAL_TABLET | Freq: Every day | ORAL | Status: DC

## 2016-06-16 NOTE — Telephone Encounter (Signed)
Pt is wanting this medication sent to the [facility] in [place] Phone: [phone]

## 2016-06-16 NOTE — Telephone Encounter (Signed)
Called pt to verify medication she is needing. She is wanting generic remeron (Mirtazapine). Inform pt will send to [facility].../lmb

## 2016-06-16 NOTE — Telephone Encounter (Signed)
Patient called requesting a medication. I have no idea what she was asking for. She did not have the bottle with her. She was trying to spell it but I could not understand what she was asking for it. She did not know the mg or when she took it. Just said I take it when I need it. She also states she is out of town in [place] and would like it sent there to the [facility] and would like it done now. I informed her it can take 24 to 48 hours. Can you please follow up with her. Im sorry, thank you!

## 2016-07-14 ENCOUNTER — Other Ambulatory Visit: Payer: Self-pay | Admitting: Family

## 2016-09-04 ENCOUNTER — Telehealth: Payer: Self-pay | Admitting: Family

## 2016-09-04 MED ORDER — MIRTAZAPINE 15 MG PO TABS: 15.0000 mg | ORAL_TABLET | Freq: Every day | ORAL | 5 refills | Status: AC

## 2016-09-04 NOTE — Telephone Encounter (Signed)
Rec'd call pt requesting refill on the generic remeron. Verified pharmacy inform will send to [facility] in [place]...Sierra English

## 2016-09-04 NOTE — Telephone Encounter (Signed)
Patient Name: Sierra English DOB: 1956-12-20 Initial Comment caller states she needs a refill on Mirtazaphine sent to [facility] in [place], [place] Nurse Assessment Nurse: Ronnald Ramp, RN, Miranda Date/Time Eilene Ghazi Time): 09/04/2016 1:48:35 PM Confirm and document reason for call. If symptomatic, describe symptoms. You must click the next button to save text entered. ---Caller states she is needing a medication refill. Has the patient traveled out of the country within the last 30 days? ---Not Applicable Does the patient have any new or worsening symptoms? ---No Please document clinical information provided and list any resource used. ---The office was in a meeting when the patient called. Transferred caller to the office for further assistance. Guidelines Guideline Title Affirmed Question Affirmed Notes Final Disposition User Clinical Call Ronnald Ramp, RN, [employer]

## 2016-09-04 NOTE — Addendum Note (Signed)
Addended by: Earnstine Regal on: 09/04/2016 02:39 PM   Modules accepted: Orders

## 2016-10-09 ENCOUNTER — Other Ambulatory Visit: Payer: Self-pay | Admitting: *Deleted

## 2016-10-09 NOTE — Telephone Encounter (Signed)
Wife called and wanting to get a refill on husband BP medication. Completed refill under husband chart...Sierra English

## 2017-03-01 IMAGING — US US SOFT TISSUE HEAD/NECK
1 series · 14 of 25 positions shown · non-contrast
Comparison: None.

CLINICAL DATA: Elevated TSH fifth

EXAM:
THYROID ULTRASOUND
TECHNIQUE: Ultrasound examination of the thyroid gland and adjacent soft
tissues was performed.

[Series 1: us soft tissue head/neck · 0.08mm/px · 14 of 56 slices shown]
[im 1/56]
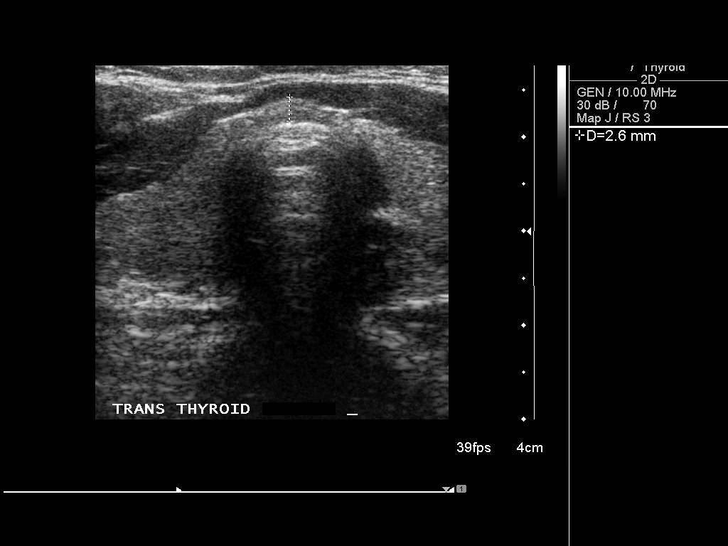
[im 5/56]
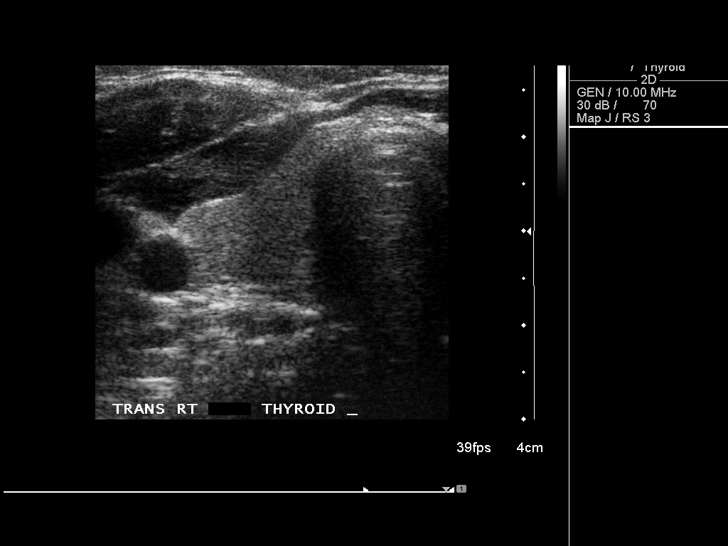
[im 10/56]
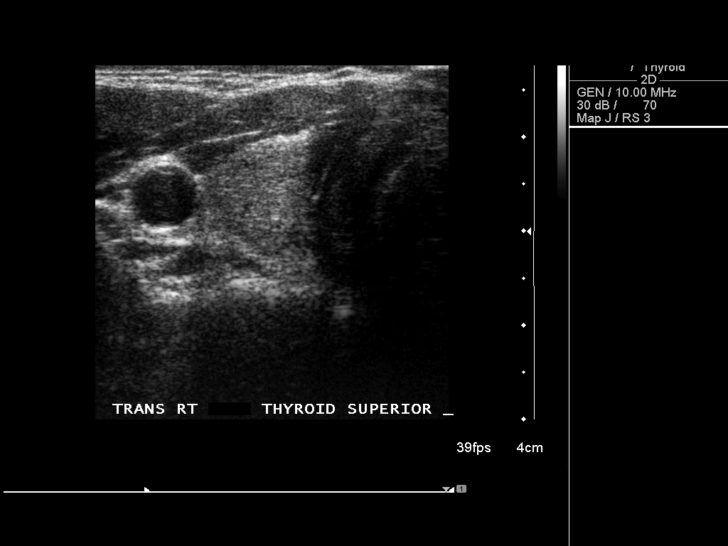
[im 14/56]
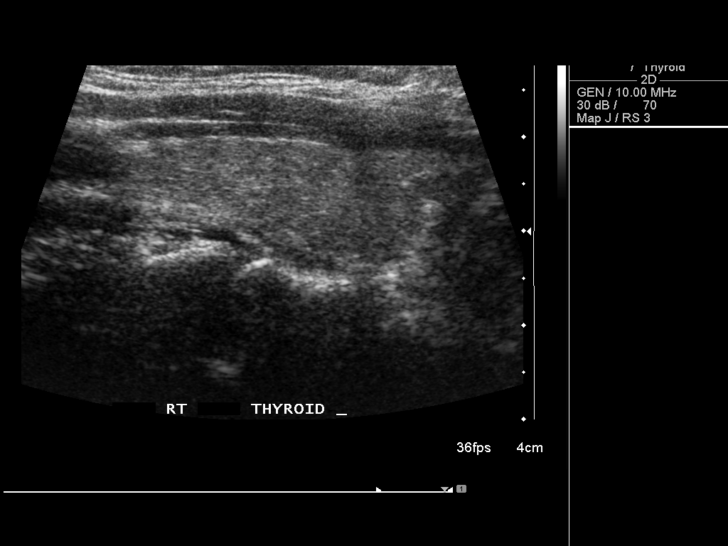
[im 19/56]
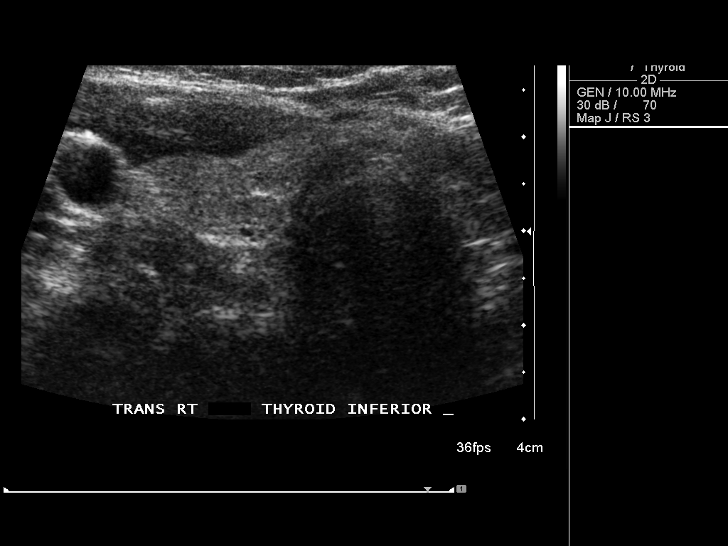
[im 21/56]
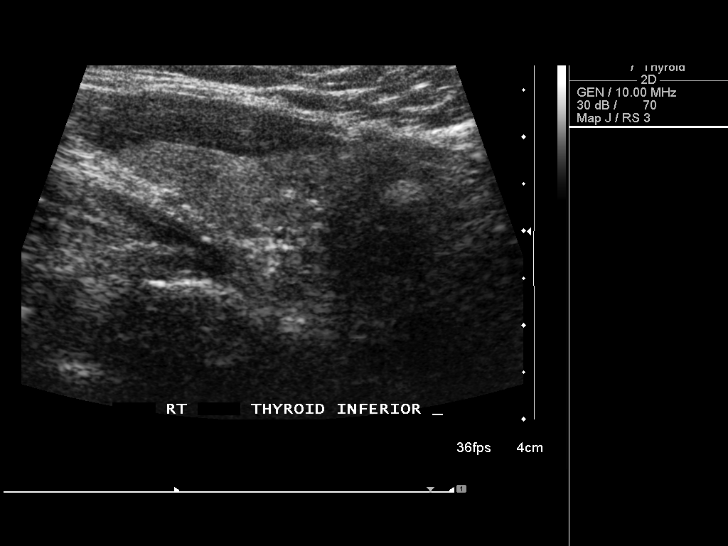
[im 26/56]
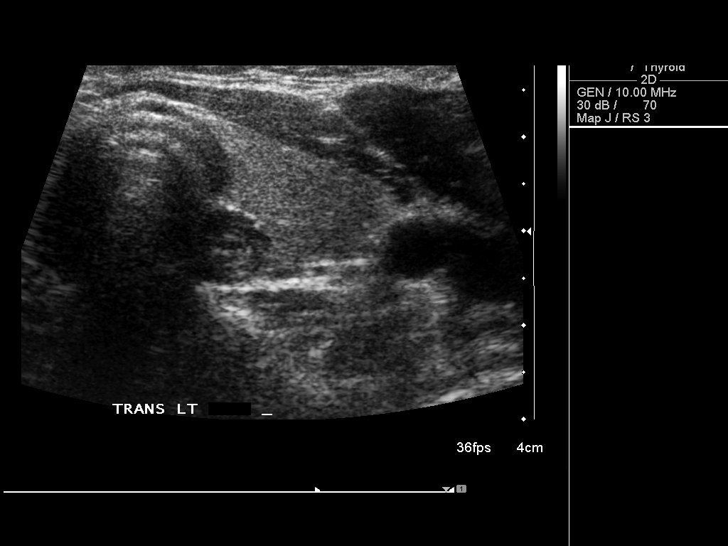
[im 30/56]
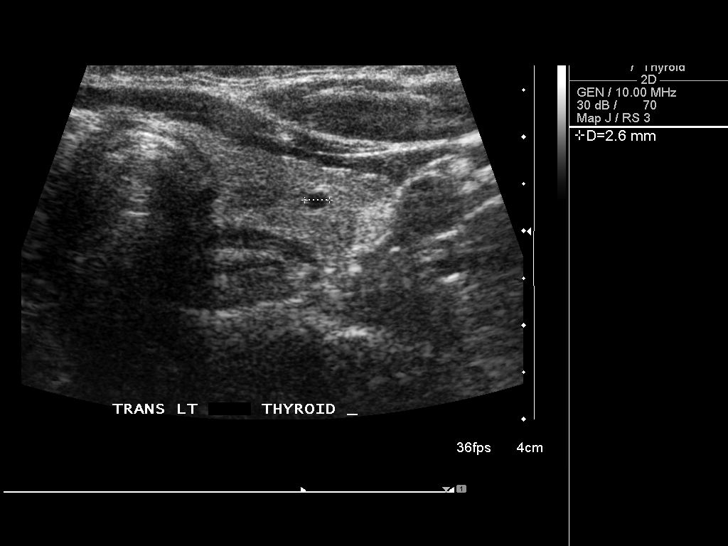
[im 35/56]
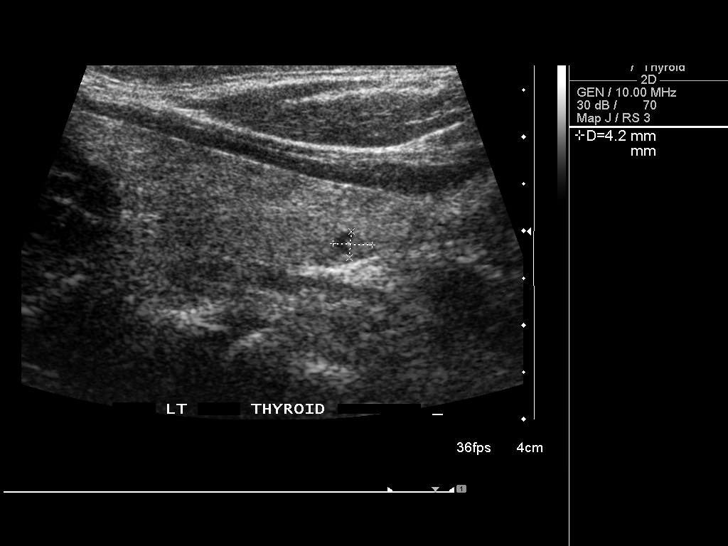
[im 37/56]
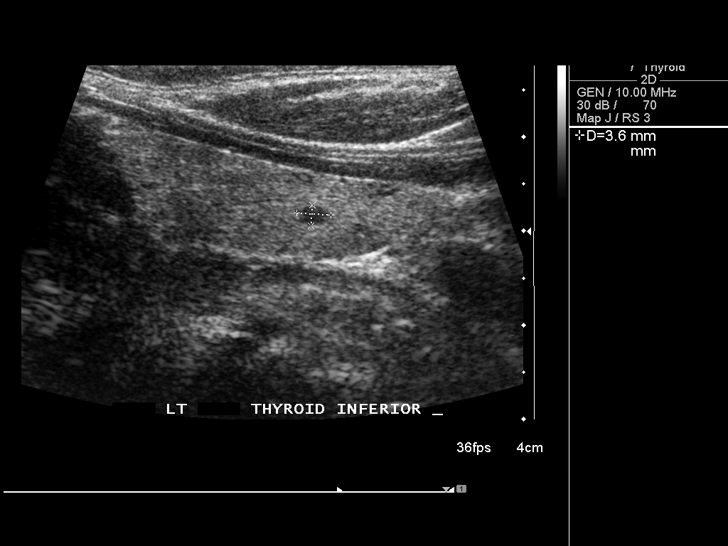
[im 42/56]
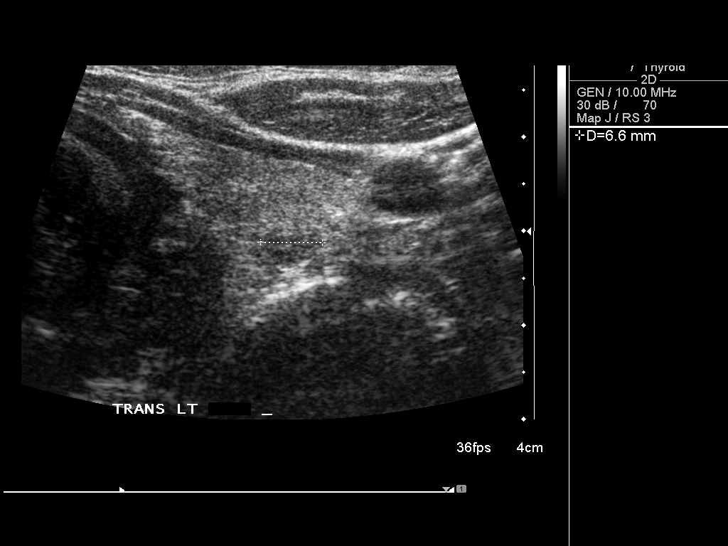
[im 46/56]
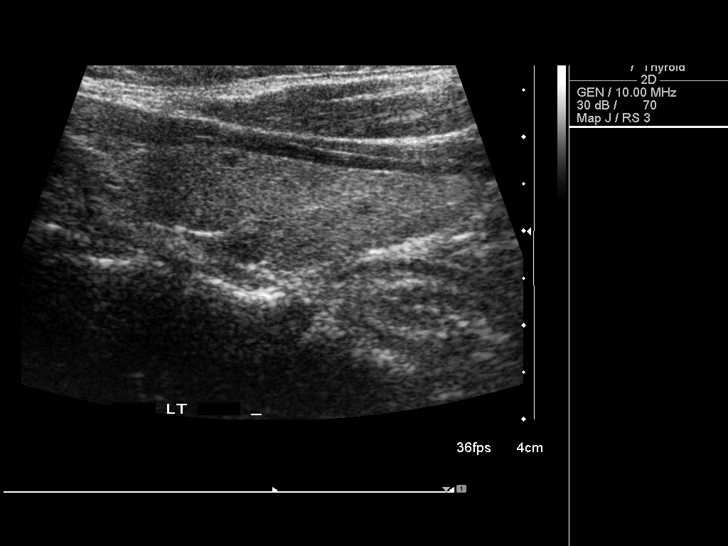
[im 51/56]
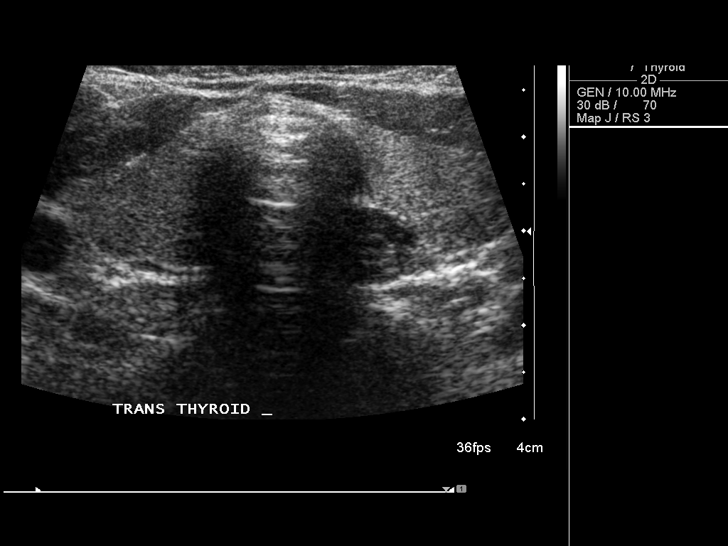
[im 56/56]
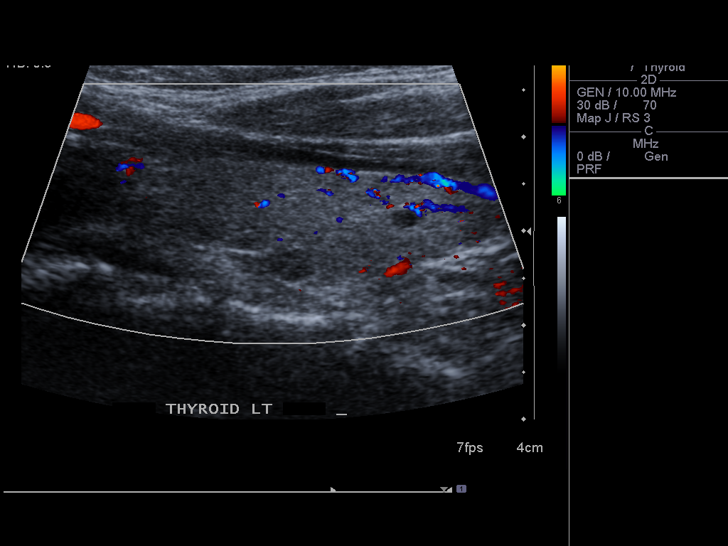

[14 of 25 positions shown; findings below may reference images not displayed]

FINDINGS: Right thyroid lobe

Measurements: 44 x 16 x 14 mm. Homogeneous background echotexture.
Nearly isoechoic 8 x 5 x 7 mm solid nodule, inferior pole.

Left thyroid lobe

Measurements: 40x 13 x 18 mm. At least 4 small hypoechoic/ cystic
nodules, largest 7 x 3 x 5 mm, inferior pole.

Isthmus

Thickness: 3 mm.  No nodules visualized.

Lymphadenopathy

None visualized.
IMPRESSION: 1. Normal-sized thyroid with small bilateral nodules and cysts.
Findings do not meet current consensus criteria for biopsy.
Follow-up by clinical exam is recommended. If patient has known risk
factors for thyroid carcinoma, consider follow-up ultrasound in 12
months. If patient is clinically hyperthyroid, consider nuclear
medicine thyroid uptake and scan. This recommendation follows the
consensus statement: Management of Thyroid Nodules Detected as US:
Society of Radiologists in Ultrasound Consensus Conference

## 2017-10-20 DIAGNOSIS — Z0271 Encounter for disability determination: Secondary | ICD-10-CM

## 2018-02-08 ENCOUNTER — Telehealth: Payer: Self-pay | Admitting: Internal Medicine

## 2018-02-08 NOTE — Telephone Encounter (Signed)
Pt is returning call. Explained to pt she needs to make an apt w/CY before he can fill this medication. Per pt she is living in [place] and is unable to come to an apt in [place]. Cb is [phone]

## 2018-02-08 NOTE — Telephone Encounter (Signed)
Called pt back telling her that we needed to get her to come in for an appt due to it being almost 2 years since pt has been seen at our office.  Pt states that she no longer lives in [place] and is now living in [place] so she does not think she will be able to make it in for an appt due to living too far away.  Pt is wanting to know if CY would go ahead and let her have a refill of her levocetirzine.  Pt states she does not have a doctor in [place] that she goes to.  Dr. Annamaria Boots, please advise on this for pt if you will allow her to have a refill of her levocetirzine or if there is another med you could recommend that pt could possibly get OTC since she is not wanting to come in for an appt.  Thanks!  Allergies  Allergen Reactions  . Amoxicillin   . Azithromycin   . Hydrocodone   . Lidocaine Palpitations     Current Outpatient Medications:  .  albuterol (PROAIR HFA) 108 (90 Base) MCG/ACT inhaler, Inhale 2 puffs into the lungs every 6 (six) hours as needed for wheezing or shortness of breath., Disp: 1 Inhaler, Rfl: 2 .  ALPHA-LIPOIC ACID PO, Take by mouth., Disp: , Rfl:  .  ASTAXANTHIN PO, Take by mouth. With DHA, Disp: , Rfl:  .  BENFOTIAMINE PO, Take 250 mg by mouth., Disp: , Rfl:  .  CINNAMON PO, Take by mouth. Cinnamon27, Disp: , Rfl:  .  doxycycline (VIBRA-TABS) 100 MG tablet, Take 1 tablet (100 mg total) by mouth 2 (two) times daily., Disp: 11 tablet, Rfl: 0 .  ezetimibe (ZETIA) 10 MG tablet, Take 1 tablet (10 mg total) by mouth daily., Disp: 90 tablet, Rfl: 1 .  Iodine 2 % TINC, Apply topically. Nascent Iodine 2%, Disp: , Rfl:  .  KRILL OIL PO, Take by mouth. [employer], Disp: , Rfl:  .  levocetirizine (XYZAL) 5 MG tablet, Take 1 tablet by mouth daily. Reported on 03/19/2016, Disp: , Rfl: 0 .  MAGNESIUM PO, Take by mouth. [employer] Magnesium Oil spray, Disp: , Rfl:  .  metFORMIN (GLUCOPHAGE-XR) 500 MG 24 hr tablet, TAKE 1 TABLET BY MOUTH EVERY DAY WITH BREAKFAST,  Disp: 90 tablet, Rfl: 1 .  mirtazapine (REMERON) 15 MG tablet, Take 1 tablet (15 mg total) by mouth at bedtime., Disp: 30 tablet, Rfl: 5 .  NONFORMULARY OR COMPOUNDED ITEM, Allergy Vaccine 1:50 Given at [facility], Disp: , Rfl:  .  pravastatin (PRAVACHOL) 20 MG tablet, Take 1 tablet (20 mg total) by mouth daily., Disp: 30 tablet, Rfl: 0 .  Probiotic Product (PROBIOTIC DAILY PO), Take by mouth. Soilbased Probiotic: Prescript-Assist brand, Disp: , Rfl:  .  UNABLE TO FIND, Med Name: MaxiFocus Sublingual Liposomal drops, Disp: , Rfl:  .  UNABLE TO FIND, Med Name: Vonzella Nipple, Disp: , Rfl:  .  UNABLE TO FIND, Med Name: Taurine Spray, Disp: , Rfl:  .  UNABLE TO FIND, Med Name: ActiveLife, Disp: , Rfl:  .  UNABLE TO FIND, Med Name: B4 Health Spray, Disp: , Rfl:  .  UNABLE TO FIND, Med Name: Joint and Skin Matrix, Disp: , Rfl:  .  UNABLE TO FIND, Med Name: Pancreas + Support Spray: Glyco Spray, Disp: , Rfl:  .  UNABLE TO FIND, Med Name: [id number], Disp: , Rfl:  .  Vitamin D-Vitamin K (VITAMIN K2-VITAMIN D3 PO), Take by  mouth. Vitamin D3 -Vitamin K-2 Sublingual spray, Disp: , Rfl:

## 2018-02-08 NOTE — Telephone Encounter (Signed)
That is Xyzal, which is now available over the counter, without a prescription.

## 2018-02-08 NOTE — Telephone Encounter (Signed)
Noted.  Will call pt back to let her know she can get it OTC.  Called pt stating that the levocetirzine was also known as Xyzal and she could get it OTC without an Rx.  Pt expressed understanding. Nothing further needed.

## 2018-02-08 NOTE — Telephone Encounter (Signed)
Looked through pt's med list to see if I could see which allergy medication pt was taking and I see that pt is currently on levocetirzine 5mg .  Pt has not been seen at our office since 03/04/16 where she last saw Dr. Annamaria Boots.  There is no current appt scheduled for pt.  Pt had also cancelled all of her injection appts she was supposed to have had in 2017.  Per pt's visit with CY 03/04/16:  Instructions  Return in about 6 months (around 09/04/2016)  Attempted to call pt so I could let her know we needed her to schedule an appt at our office before we could refill her allergy meds for her or she could call her PCP to see if they would refill it for her but pt did not answer.  Left message for pt to call us back x1
